# Patient Record
Sex: Male | Born: 1965 | Race: White | Hispanic: No | Marital: Married | Smoking: Current every day smoker
Health system: Southern US, Community
[De-identification: ages and names within clinical notes are randomized; demographics above are authoritative.]

## PROBLEM LIST (undated history)

## (undated) DIAGNOSIS — Z86718 Personal history of other venous thrombosis and embolism: Secondary | ICD-10-CM

## (undated) DIAGNOSIS — E785 Hyperlipidemia, unspecified: Secondary | ICD-10-CM

## (undated) DIAGNOSIS — S129XXA Fracture of neck, unspecified, initial encounter: Secondary | ICD-10-CM

## (undated) DIAGNOSIS — I2510 Atherosclerotic heart disease of native coronary artery without angina pectoris: Secondary | ICD-10-CM

## (undated) DIAGNOSIS — I77819 Aortic ectasia, unspecified site: Secondary | ICD-10-CM

## (undated) DIAGNOSIS — Z86711 Personal history of pulmonary embolism: Secondary | ICD-10-CM

## (undated) DIAGNOSIS — R918 Other nonspecific abnormal finding of lung field: Secondary | ICD-10-CM

## (undated) DIAGNOSIS — M1990 Unspecified osteoarthritis, unspecified site: Secondary | ICD-10-CM

## (undated) DIAGNOSIS — S069X9A Unspecified intracranial injury with loss of consciousness of unspecified duration, initial encounter: Secondary | ICD-10-CM

## (undated) DIAGNOSIS — R030 Elevated blood-pressure reading, without diagnosis of hypertension: Secondary | ICD-10-CM

## (undated) DIAGNOSIS — K760 Fatty (change of) liver, not elsewhere classified: Secondary | ICD-10-CM

## (undated) DIAGNOSIS — J329 Chronic sinusitis, unspecified: Secondary | ICD-10-CM

## (undated) DIAGNOSIS — I708 Atherosclerosis of other arteries: Secondary | ICD-10-CM

## (undated) DIAGNOSIS — J410 Simple chronic bronchitis: Secondary | ICD-10-CM

## (undated) DIAGNOSIS — R7303 Prediabetes: Secondary | ICD-10-CM

## (undated) DIAGNOSIS — Z973 Presence of spectacles and contact lenses: Secondary | ICD-10-CM

## (undated) DIAGNOSIS — Z87820 Personal history of traumatic brain injury: Secondary | ICD-10-CM

## (undated) DIAGNOSIS — I700 Atherosclerosis of aorta: Secondary | ICD-10-CM

## (undated) HISTORY — DX: Fatty (change of) liver, not elsewhere classified: K76.0

## (undated) HISTORY — PX: FACIAL FRACTURE SURGERY: SHX1570

## (undated) HISTORY — DX: Hyperlipidemia, unspecified: E78.5

## (undated) HISTORY — DX: Other nonspecific abnormal finding of lung field: R91.8

## (undated) HISTORY — DX: Aortic ectasia, unspecified site: I77.819

## (undated) HISTORY — DX: Prediabetes: R73.03

## (undated) HISTORY — DX: Atherosclerosis of aorta: I70.0

## (undated) HISTORY — DX: Atherosclerosis of other arteries: I70.8

## (undated) HISTORY — DX: Atherosclerotic heart disease of native coronary artery without angina pectoris: I25.10

---

## 2012-03-01 ENCOUNTER — Encounter (HOSPITAL_COMMUNITY): Payer: Self-pay | Admitting: Pharmacy Technician

## 2012-03-03 ENCOUNTER — Other Ambulatory Visit: Payer: Self-pay | Admitting: Neurological Surgery

## 2012-03-10 ENCOUNTER — Ambulatory Visit (HOSPITAL_COMMUNITY)
Admission: RE | Admit: 2012-03-10 | Discharge: 2012-03-10 | Disposition: A | Discharge status: Home / Self Care | Payer: 59 | Source: Ambulatory Visit | Attending: Neurological Surgery | Admitting: Neurological Surgery

## 2012-03-10 ENCOUNTER — Encounter (HOSPITAL_COMMUNITY): Payer: Self-pay

## 2012-03-10 ENCOUNTER — Encounter (HOSPITAL_COMMUNITY)
Admission: RE | Admit: 2012-03-10 | Discharge: 2012-03-10 | Disposition: A | Discharge status: Home / Self Care | Payer: 59 | Source: Ambulatory Visit | Attending: Neurological Surgery | Admitting: Neurological Surgery

## 2012-03-10 DIAGNOSIS — Z01812 Encounter for preprocedural laboratory examination: Secondary | ICD-10-CM | POA: Insufficient documentation

## 2012-03-10 DIAGNOSIS — Z01810 Encounter for preprocedural cardiovascular examination: Secondary | ICD-10-CM | POA: Insufficient documentation

## 2012-03-10 DIAGNOSIS — Z01818 Encounter for other preprocedural examination: Secondary | ICD-10-CM | POA: Insufficient documentation

## 2012-03-10 HISTORY — DX: Fracture of neck, unspecified, initial encounter: S12.9XXA

## 2012-03-10 HISTORY — DX: Personal history of other venous thrombosis and embolism: Z86.718

## 2012-03-10 HISTORY — DX: Unspecified osteoarthritis, unspecified site: M19.90

## 2012-03-10 HISTORY — DX: Unspecified intracranial injury with loss of consciousness of unspecified duration, initial encounter: S06.9X9A

## 2012-03-10 LAB — CBC WITH DIFFERENTIAL/PLATELET
Basophils Absolute: 0.1 10*3/uL (ref 0.0–0.1)
Basophils Relative: 1 % (ref 0–1)
Eosinophils Absolute: 0.5 10*3/uL (ref 0.0–0.7)
Eosinophils Relative: 6 % — ABNORMAL HIGH (ref 0–5)
HCT: 43.5 % (ref 39.0–52.0)
Hemoglobin: 15.5 g/dL (ref 13.0–17.0)
Lymphocytes Relative: 26 % (ref 12–46)
Lymphs Abs: 2.2 10*3/uL (ref 0.7–4.0)
MCH: 31.5 pg (ref 26.0–34.0)
MCHC: 35.6 g/dL (ref 30.0–36.0)
MCV: 88.4 fL (ref 78.0–100.0)
Monocytes Absolute: 0.5 10*3/uL (ref 0.1–1.0)
Monocytes Relative: 6 % (ref 3–12)
Neutro Abs: 5.1 10*3/uL (ref 1.7–7.7)
Neutrophils Relative %: 61 % (ref 43–77)
Platelets: 195 10*3/uL (ref 150–400)
RBC: 4.92 MIL/uL (ref 4.22–5.81)
RDW: 12.3 % (ref 11.5–15.5)
WBC: 8.4 10*3/uL (ref 4.0–10.5)

## 2012-03-10 LAB — SURGICAL PCR SCREEN
MRSA, PCR: NEGATIVE
Staphylococcus aureus: NEGATIVE

## 2012-03-10 LAB — PROTIME-INR
INR: 0.95 (ref 0.00–1.49)
Prothrombin Time: 12.9 seconds (ref 11.6–15.2)

## 2012-03-10 LAB — BASIC METABOLIC PANEL
BUN: 12 mg/dL (ref 6–23)
CO2: 24 mEq/L (ref 19–32)
Calcium: 9.7 mg/dL (ref 8.4–10.5)
Chloride: 103 mEq/L (ref 96–112)
Creatinine, Ser: 0.75 mg/dL (ref 0.50–1.35)
GFR calc Af Amer: >90 mL/min (ref >90)
GFR calc non Af Amer: >90 mL/min (ref >90)
Glucose, Bld: 108 mg/dL — ABNORMAL HIGH (ref 70–99)
Potassium: 4 mEq/L (ref 3.5–5.1)
Sodium: 139 mEq/L (ref 135–145)

## 2012-03-10 NOTE — Pre-Procedure Instructions (Signed)
20 Jesse Ruiz  03/10/2012   Your procedure is scheduled on:  Wednesday, August 28th.  Report to [facility] at 10:45 AM.  Call this number if you have problems the morning of surgery: [phone]   Remember:   Do not eat food or drink any liquid:After Midnight.      Take these medicines the morning of surgery with A SIP OF WATER: May take Hydrocodone - Acetaminophen if needed.  Stop taking Fish Oil and Ibuprofen.  Do not take any Aspirin NSAIDs or herbal medications.    Do not wear jewelry, make-up or nail polish.  Do not wear lotions, powders, or perfumes. You may wear deodorant.  Do not shave 48 hours prior to surgery. Men may shave face and neck.  Do not bring valuables to the hospital.  Contacts, dentures or bridgework may not be worn into surgery.  Leave suitcase in the car. After surgery it may be brought to your room.  For patients admitted to the hospital, checkout time is 11:00 AM the day of discharge.   Patients discharged the day of surgery will not be allowed to drive home.  Name and phone number of your driver: _________________  Special Instructions: CHG Shower Use Special Wash: 1/2 bottle night before surgery and 1/2 bottle morning of surgery.   Please read over the following fact sheets that you were given: Pain Booklet, Coughing and Deep Breathing and Surgical Site Infection Prevention

## 2012-03-14 MED ORDER — DEXAMETHASONE SODIUM PHOSPHATE 10 MG/ML IJ SOLN
10.0000 mg | INTRAMUSCULAR | Status: AC
  Administered 2012-03-15: 10 mg via INTRAVENOUS
  Filled 2012-03-14: qty 1

## 2012-03-14 MED ORDER — CEFAZOLIN SODIUM-DEXTROSE 2-3 GM-% IV SOLR
2.0000 g | INTRAVENOUS | Status: DC
  Filled 2012-03-14: qty 50

## 2012-03-15 ENCOUNTER — Encounter (HOSPITAL_COMMUNITY): Payer: Self-pay | Admitting: Neurological Surgery

## 2012-03-15 ENCOUNTER — Ambulatory Visit (HOSPITAL_COMMUNITY)
Admission: RE | Admit: 2012-03-15 | Discharge: 2012-03-16 | Disposition: A | Discharge status: Home / Self Care | Payer: 59 | Source: Ambulatory Visit | Attending: Neurological Surgery | Admitting: Neurological Surgery

## 2012-03-15 ENCOUNTER — Encounter (HOSPITAL_COMMUNITY): Payer: Self-pay | Admitting: *Deleted

## 2012-03-15 ENCOUNTER — Ambulatory Visit (HOSPITAL_COMMUNITY): Payer: 59 | Admitting: Anesthesiology

## 2012-03-15 ENCOUNTER — Encounter (HOSPITAL_COMMUNITY): Payer: Self-pay | Admitting: Anesthesiology

## 2012-03-15 ENCOUNTER — Ambulatory Visit (HOSPITAL_COMMUNITY): Payer: 59

## 2012-03-15 ENCOUNTER — Encounter (HOSPITAL_COMMUNITY)
Admission: RE | Disposition: A | Discharge status: Home / Self Care | Payer: Self-pay | Source: Ambulatory Visit | Attending: Neurological Surgery

## 2012-03-15 DIAGNOSIS — M4802 Spinal stenosis, cervical region: Secondary | ICD-10-CM | POA: Insufficient documentation

## 2012-03-15 DIAGNOSIS — G5600 Carpal tunnel syndrome, unspecified upper limb: Secondary | ICD-10-CM | POA: Insufficient documentation

## 2012-03-15 DIAGNOSIS — Z9889 Other specified postprocedural states: Secondary | ICD-10-CM

## 2012-03-15 HISTORY — PX: POSTERIOR CERVICAL LAMINECTOMY: SHX2248

## 2012-03-15 HISTORY — PX: CARPAL TUNNEL RELEASE: SHX101

## 2012-03-15 SURGERY — POSTERIOR CERVICAL LAMINECTOMY
Anesthesia: General | Site: Wrist | Laterality: Right | Wound class: Clean

## 2012-03-15 MED ORDER — PNEUMOCOCCAL VAC POLYVALENT 25 MCG/0.5ML IJ INJ
0.5000 mL | INJECTION | INTRAMUSCULAR | Status: DC
  Filled 2012-03-15: qty 0.5

## 2012-03-15 MED ORDER — SENNA 8.6 MG PO TABS
1.0000 | ORAL_TABLET | Freq: Two times a day (BID) | ORAL | Status: DC
  Administered 2012-03-15 – 2012-03-16 (×2): 8.6 mg via ORAL
  Filled 2012-03-15 (×3): qty 1

## 2012-03-15 MED ORDER — ALBUTEROL SULFATE HFA 108 (90 BASE) MCG/ACT IN AERS
INHALATION_SPRAY | RESPIRATORY_TRACT | Status: DC | PRN
  Administered 2012-03-15: 6 via RESPIRATORY_TRACT

## 2012-03-15 MED ORDER — MENTHOL 3 MG MT LOZG: 1.0000 | LOZENGE | OROMUCOSAL | Status: DC | PRN

## 2012-03-15 MED ORDER — HYDROMORPHONE HCL PF 1 MG/ML IJ SOLN
0.2500 mg | INTRAMUSCULAR | Status: DC | PRN
  Administered 2012-03-15: 0.25 mg via INTRAVENOUS

## 2012-03-15 MED ORDER — CELECOXIB 200 MG PO CAPS
200.0000 mg | ORAL_CAPSULE | Freq: Two times a day (BID) | ORAL | Status: DC
  Administered 2012-03-15 – 2012-03-16 (×2): 200 mg via ORAL
  Filled 2012-03-15 (×3): qty 1

## 2012-03-15 MED ORDER — ACETAMINOPHEN 650 MG RE SUPP: 650.0000 mg | RECTAL | Status: DC | PRN

## 2012-03-15 MED ORDER — CEFAZOLIN SODIUM 1-5 GM-% IV SOLN
1.0000 g | Freq: Three times a day (TID) | INTRAVENOUS | Status: AC
  Administered 2012-03-15 – 2012-03-16 (×2): 1 g via INTRAVENOUS
  Filled 2012-03-15 (×2): qty 50

## 2012-03-15 MED ORDER — KETOROLAC TROMETHAMINE 15 MG/ML IJ SOLN
INTRAMUSCULAR | Status: DC | PRN
  Administered 2012-03-15: 30 mg via INTRAVENOUS

## 2012-03-15 MED ORDER — BACITRACIN 50000 UNITS IM SOLR
INTRAMUSCULAR | Status: AC
  Filled 2012-03-15: qty 1

## 2012-03-15 MED ORDER — THROMBIN 5000 UNITS EX KIT
PACK | CUTANEOUS | Status: DC | PRN
  Administered 2012-03-15 (×2): 5000 [IU] via TOPICAL

## 2012-03-15 MED ORDER — POTASSIUM CHLORIDE IN NACL 20-0.9 MEQ/L-% IV SOLN
INTRAVENOUS | Status: DC
  Filled 2012-03-15 (×3): qty 1000

## 2012-03-15 MED ORDER — LIDOCAINE HCL (CARDIAC) 20 MG/ML IV SOLN
INTRAVENOUS | Status: DC | PRN
  Administered 2012-03-15: 80 mg via INTRAVENOUS

## 2012-03-15 MED ORDER — FENTANYL CITRATE 0.05 MG/ML IJ SOLN
INTRAMUSCULAR | Status: DC | PRN
  Administered 2012-03-15: 200 ug via INTRAVENOUS
  Administered 2012-03-15 (×3): 50 ug via INTRAVENOUS

## 2012-03-15 MED ORDER — ACETAMINOPHEN 325 MG PO TABS: 650.0000 mg | ORAL_TABLET | ORAL | Status: DC | PRN

## 2012-03-15 MED ORDER — DEXAMETHASONE 4 MG PO TABS
4.0000 mg | ORAL_TABLET | Freq: Four times a day (QID) | ORAL | Status: DC
  Administered 2012-03-15 – 2012-03-16 (×2): 4 mg via ORAL
  Filled 2012-03-15 (×6): qty 1

## 2012-03-15 MED ORDER — HYDROCODONE-ACETAMINOPHEN 5-325 MG PO TABS
ORAL_TABLET | ORAL | Status: AC
  Filled 2012-03-15: qty 2

## 2012-03-15 MED ORDER — SODIUM CHLORIDE 0.9 % IV SOLN
INTRAVENOUS | Status: AC
  Filled 2012-03-15: qty 500

## 2012-03-15 MED ORDER — ONDANSETRON HCL 4 MG/2ML IJ SOLN
INTRAMUSCULAR | Status: DC | PRN
  Administered 2012-03-15: 4 mg via INTRAVENOUS

## 2012-03-15 MED ORDER — ROCURONIUM BROMIDE 100 MG/10ML IV SOLN
INTRAVENOUS | Status: DC | PRN
  Administered 2012-03-15 (×2): 10 mg via INTRAVENOUS
  Administered 2012-03-15: 50 mg via INTRAVENOUS
  Administered 2012-03-15: 10 mg via INTRAVENOUS
  Administered 2012-03-15: 20 mg via INTRAVENOUS

## 2012-03-15 MED ORDER — LIDOCAINE HCL 4 % MT SOLN
OROMUCOSAL | Status: DC | PRN
  Administered 2012-03-15: 4 mL via TOPICAL

## 2012-03-15 MED ORDER — NEOSTIGMINE METHYLSULFATE 1 MG/ML IJ SOLN
INTRAMUSCULAR | Status: DC | PRN
  Administered 2012-03-15: 3 mg via INTRAVENOUS

## 2012-03-15 MED ORDER — PROPOFOL 10 MG/ML IV EMUL
INTRAVENOUS | Status: DC | PRN
  Administered 2012-03-15: 200 mg via INTRAVENOUS

## 2012-03-15 MED ORDER — HYDROCODONE-ACETAMINOPHEN 5-325 MG PO TABS
1.0000 | ORAL_TABLET | ORAL | Status: DC | PRN
  Administered 2012-03-15 – 2012-03-16 (×4): 2 via ORAL
  Filled 2012-03-15 (×3): qty 2

## 2012-03-15 MED ORDER — THROMBIN 5000 UNITS EX SOLR
OROMUCOSAL | Status: DC | PRN
  Administered 2012-03-15: 15:00:00 via TOPICAL

## 2012-03-15 MED ORDER — ONDANSETRON HCL 4 MG/2ML IJ SOLN: 4.0000 mg | INTRAMUSCULAR | Status: DC | PRN

## 2012-03-15 MED ORDER — 0.9 % SODIUM CHLORIDE (POUR BTL) OPTIME
TOPICAL | Status: DC | PRN
  Administered 2012-03-15: 1000 mL

## 2012-03-15 MED ORDER — CYCLOBENZAPRINE HCL 10 MG PO TABS
ORAL_TABLET | ORAL | Status: AC
Start: 2012-03-15 — End: 2012-03-16
  Filled 2012-03-15: qty 1

## 2012-03-15 MED ORDER — ZOLPIDEM TARTRATE 5 MG PO TABS: 5.0000 mg | ORAL_TABLET | Freq: Every evening | ORAL | Status: DC | PRN

## 2012-03-15 MED ORDER — HYDROMORPHONE HCL PF 1 MG/ML IJ SOLN
INTRAMUSCULAR | Status: AC
  Filled 2012-03-15: qty 1

## 2012-03-15 MED ORDER — MIDAZOLAM HCL 5 MG/5ML IJ SOLN
INTRAMUSCULAR | Status: DC | PRN
  Administered 2012-03-15: 2 mg via INTRAVENOUS

## 2012-03-15 MED ORDER — MORPHINE SULFATE 2 MG/ML IJ SOLN
1.0000 mg | INTRAMUSCULAR | Status: DC | PRN
  Administered 2012-03-16: 2 mg via INTRAVENOUS
  Filled 2012-03-15: qty 1

## 2012-03-15 MED ORDER — DEXAMETHASONE SODIUM PHOSPHATE 4 MG/ML IJ SOLN
4.0000 mg | Freq: Four times a day (QID) | INTRAMUSCULAR | Status: DC
  Administered 2012-03-15: 4 mg via INTRAVENOUS
  Filled 2012-03-15 (×5): qty 1

## 2012-03-15 MED ORDER — HEMOSTATIC AGENTS (NO CHARGE) OPTIME
TOPICAL | Status: DC | PRN
  Administered 2012-03-15: 1 via TOPICAL

## 2012-03-15 MED ORDER — SODIUM CHLORIDE 0.9 % IJ SOLN: 3.0000 mL | INTRAMUSCULAR | Status: DC | PRN

## 2012-03-15 MED ORDER — SODIUM CHLORIDE 0.9 % IJ SOLN
3.0000 mL | Freq: Two times a day (BID) | INTRAMUSCULAR | Status: DC
  Administered 2012-03-15 – 2012-03-16 (×2): 3 mL via INTRAVENOUS

## 2012-03-15 MED ORDER — GLYCOPYRROLATE 0.2 MG/ML IJ SOLN
INTRAMUSCULAR | Status: DC | PRN
  Administered 2012-03-15: .4 mg via INTRAVENOUS

## 2012-03-15 MED ORDER — SODIUM CHLORIDE 0.9 % IV SOLN: 250.0000 mL | INTRAVENOUS | Status: DC

## 2012-03-15 MED ORDER — ONDANSETRON HCL 4 MG/2ML IJ SOLN: 4.0000 mg | Freq: Once | INTRAMUSCULAR | Status: DC | PRN

## 2012-03-15 MED ORDER — PHENOL 1.4 % MT LIQD: 1.0000 | OROMUCOSAL | Status: DC | PRN

## 2012-03-15 MED ORDER — LACTATED RINGERS IV SOLN
INTRAVENOUS | Status: DC | PRN
  Administered 2012-03-15 (×2): via INTRAVENOUS

## 2012-03-15 MED ORDER — SODIUM CHLORIDE 0.9 % IR SOLN
Status: DC | PRN
  Administered 2012-03-15 (×2)

## 2012-03-15 MED ORDER — PHENYLEPHRINE HCL 10 MG/ML IJ SOLN
INTRAMUSCULAR | Status: DC | PRN
  Administered 2012-03-15 (×2): 80 ug via INTRAVENOUS

## 2012-03-15 MED ORDER — BUPIVACAINE HCL (PF) 0.25 % IJ SOLN
INTRAMUSCULAR | Status: DC | PRN
  Administered 2012-03-15: 30 mL
  Administered 2012-03-15: 9 mL

## 2012-03-15 MED ORDER — CYCLOBENZAPRINE HCL 10 MG PO TABS
10.0000 mg | ORAL_TABLET | Freq: Three times a day (TID) | ORAL | Status: DC | PRN
  Administered 2012-03-15 – 2012-03-16 (×2): 10 mg via ORAL
  Filled 2012-03-15: qty 1

## 2012-03-15 SURGICAL SUPPLY — 63 items
BAG DECANTER FOR FLEXI CONT (MISCELLANEOUS) ×6 IMPLANT
BANDAGE ELASTIC 4 VELCRO ST LF (GAUZE/BANDAGES/DRESSINGS) ×3 IMPLANT
BANDAGE GAUZE ELAST BULKY 4 IN (GAUZE/BANDAGES/DRESSINGS) ×3 IMPLANT
BENZOIN TINCTURE PRP APPL 2/3 (GAUZE/BANDAGES/DRESSINGS) IMPLANT
BLADE SURG 15 STRL LF DISP TIS (BLADE) ×2 IMPLANT
BLADE SURG 15 STRL SS (BLADE) ×1
BLADE SURG ROTATE 9660 (MISCELLANEOUS) IMPLANT
BUR MATCHSTICK NEURO 3.0 LAGG (BURR) ×3 IMPLANT
CANISTER SUCTION 2500CC (MISCELLANEOUS) ×6 IMPLANT
CLOTH BEACON ORANGE TIMEOUT ST (SAFETY) ×6 IMPLANT
CONT SPEC 4OZ CLIKSEAL STRL BL (MISCELLANEOUS) ×3 IMPLANT
CORDS BIPOLAR (ELECTRODE) ×3 IMPLANT
DRAPE C-ARM 42X72 X-RAY (DRAPES) ×6 IMPLANT
DRAPE EXTREMITY T 121X128X90 (DRAPE) ×3 IMPLANT
DRAPE LAPAROTOMY 100X72 PEDS (DRAPES) ×3 IMPLANT
DRAPE MICROSCOPE LEICA (MISCELLANEOUS) ×3 IMPLANT
DRAPE MICROSCOPE ZEISS OPMI (DRAPES) IMPLANT
DRAPE POUCH INSTRU U-SHP 10X18 (DRAPES) ×3 IMPLANT
DRESSING TELFA 8X3 (GAUZE/BANDAGES/DRESSINGS) ×3 IMPLANT
DRSG EMULSION OIL 3X3 NADH (GAUZE/BANDAGES/DRESSINGS) ×3 IMPLANT
DRSG OPSITE 4X5.5 SM (GAUZE/BANDAGES/DRESSINGS) ×3 IMPLANT
DURAPREP 26ML APPLICATOR (WOUND CARE) ×6 IMPLANT
ELECT REM PT RETURN 9FT ADLT (ELECTROSURGICAL) ×3
ELECTRODE REM PT RTRN 9FT ADLT (ELECTROSURGICAL) ×2 IMPLANT
GAUZE SPONGE 4X4 16PLY XRAY LF (GAUZE/BANDAGES/DRESSINGS) ×3 IMPLANT
GLOVE BIO SURGEON STRL SZ8 (GLOVE) ×6 IMPLANT
GLOVE BIOGEL PI IND STRL 7.0 (GLOVE) ×4 IMPLANT
GLOVE BIOGEL PI INDICATOR 7.0 (GLOVE) ×2
GLOVE SURG SS PI 7.0 STRL IVOR (GLOVE) ×12 IMPLANT
GOWN BRE IMP SLV AUR LG STRL (GOWN DISPOSABLE) IMPLANT
GOWN BRE IMP SLV AUR XL STRL (GOWN DISPOSABLE) ×12 IMPLANT
GOWN STRL REIN 2XL LVL4 (GOWN DISPOSABLE) IMPLANT
HEMOSTAT POWDER KIT SURGIFOAM (HEMOSTASIS) ×3 IMPLANT
KIT BASIN OR (CUSTOM PROCEDURE TRAY) ×6 IMPLANT
KIT ROOM TURNOVER OR (KITS) ×3 IMPLANT
NEEDLE HYPO 22GX1.5 SAFETY (NEEDLE) ×3 IMPLANT
NEEDLE HYPO 25X1 1.5 SAFETY (NEEDLE) ×3 IMPLANT
NEEDLE SPNL 20GX3.5 QUINCKE YW (NEEDLE) IMPLANT
NS IRRIG 1000ML POUR BTL (IV SOLUTION) ×6 IMPLANT
PACK LAMINECTOMY NEURO (CUSTOM PROCEDURE TRAY) ×3 IMPLANT
PACK SURGICAL SETUP 50X90 (CUSTOM PROCEDURE TRAY) ×3 IMPLANT
PAD ARMBOARD 7.5X6 YLW CONV (MISCELLANEOUS) ×15 IMPLANT
PIN MAYFIELD SKULL DISP (PIN) ×3 IMPLANT
RUBBERBAND STERILE (MISCELLANEOUS) ×6 IMPLANT
SPONGE GAUZE 4X4 12PLY (GAUZE/BANDAGES/DRESSINGS) IMPLANT
SPONGE LAP 4X18 X RAY DECT (DISPOSABLE) IMPLANT
SPONGE SURGIFOAM ABS GEL SZ50 (HEMOSTASIS) ×3 IMPLANT
STOCKINETTE 4X48 STRL (DRAPES) ×3 IMPLANT
STRIP CLOSURE SKIN 1/2X4 (GAUZE/BANDAGES/DRESSINGS) ×3 IMPLANT
SUT ETHILON 4 0 PS 2 18 (SUTURE) ×3 IMPLANT
SUT VIC AB 0 CT1 18XCR BRD8 (SUTURE) ×2 IMPLANT
SUT VIC AB 0 CT1 8-18 (SUTURE) ×1
SUT VIC AB 2-0 CP2 18 (SUTURE) ×3 IMPLANT
SUT VIC AB 3-0 SH 8-18 (SUTURE) ×9 IMPLANT
SWABSTICK BENZOIN STERILE (MISCELLANEOUS) ×3 IMPLANT
SYR 20ML ECCENTRIC (SYRINGE) ×3 IMPLANT
SYR BULB 3OZ (MISCELLANEOUS) ×3 IMPLANT
SYR CONTROL 10ML LL (SYRINGE) ×3 IMPLANT
TOWEL OR 17X24 6PK STRL BLUE (TOWEL DISPOSABLE) ×6 IMPLANT
TOWEL OR 17X26 10 PK STRL BLUE (TOWEL DISPOSABLE) ×6 IMPLANT
TUBE CONNECTING 12X1/4 (SUCTIONS) ×3 IMPLANT
UNDERPAD 30X30 INCONTINENT (UNDERPADS AND DIAPERS) ×3 IMPLANT
WATER STERILE IRR 1000ML POUR (IV SOLUTION) ×6 IMPLANT

## 2012-03-15 NOTE — Addendum Note (Signed)
Addendum  created 03/15/12 1650 by Chara Marquard A Daymen Hassebrock, MD   Modules edited:Anesthesia Attestations    

## 2012-03-15 NOTE — OR Nursing (Signed)
End time for Procedure 1 Carpel tunnel release-14:30.

## 2012-03-15 NOTE — Anesthesia Postprocedure Evaluation (Signed)
  Anesthesia Post-op Note  Patient: Jesse Ruiz  Procedure(s) Performed: Procedure(s) (LRB): POSTERIOR CERVICAL LAMINECTOMY (Right) CARPAL TUNNEL RELEASE (Right)  Patient Location: PACU  Anesthesia Type: General  Level of Consciousness: awake, alert  and oriented  Airway and Oxygen Therapy: Patient Spontanous Breathing and Patient connected to nasal cannula oxygen  Post-op Pain: none  Post-op Assessment: Post-op Vital signs reviewed  Post-op Vital Signs: Reviewed  Complications: No apparent anesthesia complications

## 2012-03-15 NOTE — Op Note (Signed)
03/15/2012  4:20 PM  PATIENT:  Jesse Ruiz  46 y.o. male  PRE-OPERATIVE DIAGNOSIS:  1. 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 Right median neuropathy  POST-OPERATIVE DIAGNOSIS:  Same  PROCEDURE:  1. Right C7-T1 foraminotomy for decompression of the right C8 nerve root utilizing microscopic dissection, 2. Right carpal tunnel release  SURGEON:  Marikay Alar, MD  ASSISTANTS: Dr. Franky Macho  ANESTHESIA:   General  EBL: 100 ml  Total I/O In: 1500 [I.V.:1500] Out: 100 [Blood:100]  BLOOD ADMINISTERED:none  DRAINS: None   SPECIMEN:  No Specimen  INDICATION FOR PROCEDURE: This patient presented with progressive pain in his right arm weakness in his right hand. MRI showed spondylosis with neural foraminal stenosis C7-T1 on the right. EMGs confirmed a right C8 radiculopathy and a severe median neuropathy. Recommended a right C7-T1 foraminotomies and a right carpal tunnel release.  Patient understood the risks, benefits, and alternatives and potential outcomes and wished to proceed.  PROCEDURE DETAILS: The patient was brought to the operating room. Generalized endotracheal anesthesia was induced. His right arm was extended on an armboard and prepped circumferentially from the fingertips to above the elbow with DuraPrep. It was then draped in the usual sterile fashion. 9 cc of local anesthesia was injected and a small palmar incision was made from the distal wrist crease into the palm in line with the web space between the third and fourth digits. I dissected down through the palmar fascia and identified the transverse carpal ligament. This was opened with a 15 blade scalpel to expose the underlying median nerve. I then spread between the nerve and the ligament with a mosquito and completely transected the ligament both proximally and distally with the 15 blade scalpel or with the Metzenbaum scissors. Once this was completed I could palpate easily both proximally and distally  with a mosquito to assure adequate decompression and transection of the ligament. The nerve was free. I irrigated with saline solution containing bacitracin and dried all bleeding points. I then placed a single stitch and the palmar fascia. I then closed the subcuticular tissues with 0 Vicryl and closed the skin with interrupted 4-0 Ethilon vertical mattress sutures. The hand was then wrapped in a Curlex and Ace bandage. The patient was then repositioned in the prone position. The patient was affixed a 3 point Mayfield headrest and rolled into the prone position on chest rolls. All pressure points were padded. The posterior cervical region was cleaned and prepped with DuraPrep and then draped in the usual sterile fashion. 7 cc of local anesthesia was injected and a dorsal midline incision made in the posterior cervical region and carried down to the cervical fascia. The fascia was opened and the paraspinous musculature was taken down to expose C7-T1 on the right. Intraoperative fluoroscopy confirmed my level at C6-7 complete for visualization, therefore I moved down one level to C7-T1. and then the dissection was carried out over the lateral facets of C7 and T1. I then used the drill to drill a keyhole foraminotomies at C7-T1. I opened the yellow ligament and removed it performed a generous foraminotomies C7-T1 on the right. The C8 nerve root was identified. I marked along the nerve root with a 2 mm Kerrison punch to decompress into Osborne distal to the pedicle. I nerve hook passed easily along the pedicle and along the nerve root. I decompress the shoulder of the nerve root. I then coagulated the epidural venous vasculature in the axilla of the nerve root and  checked for a herniated disc. On a hard spur/ disc complex that I did not feel needed to be incised or removed. Again the nerve hook passed easily along the nerve the nerve appeared to be well decompressed. I irrigated with saline solution containing  bacitracin and lined the dura with Gelfoam. After hemostasis was achieved a closed the muscle and the fascia with 0 Vicryl, subcutaneous tissue with 2-0 Vicryl, and the subcuticular tissue with 3-0 Vicryl. The skin was closed with benzoin and Steri-Strips. A sterile dressing was applied, the patient was turned to the supine physician and taken out of the headrest, awakened from general anesthesia and transferred to the recovery room in stable condition. At the end of the procedure all sponge, needle and instrument counts were correct.  PLAN OF CARE: Admit for overnight observation  PATIENT DISPOSITION:  PACU - hemodynamically stable.   Delay start of Pharmacological VTE agent (>24hrs) due to surgical blood loss or risk of bleeding:  yes
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
## 2012-03-15 NOTE — Anesthesia Preprocedure Evaluation (Signed)
Anesthesia Evaluation  Patient identified by MRN, date of birth, ID band Patient awake    Reviewed: Allergy & Precautions, H&P , NPO status , Patient's Chart, lab work & pertinent test results  Airway Mallampati: I TM Distance: >3 FB Neck ROM: Full    Dental  (+) Teeth Intact and Dental Advisory Given   Pulmonary  breath sounds clear to auscultation        Cardiovascular Rhythm:Regular Rate:Normal     Neuro/Psych    GI/Hepatic   Endo/Other    Renal/GU      Musculoskeletal   Abdominal   Peds  Hematology   Anesthesia Other Findings   Reproductive/Obstetrics                           Anesthesia Physical Anesthesia Plan  ASA: II  Anesthesia Plan: General   Post-op Pain Management:    Induction: Intravenous  Airway Management Planned: Oral ETT  Additional Equipment:   Intra-op Plan:   Post-operative Plan: Extubation in OR  Informed Consent: I have reviewed the patients History and Physical, chart, labs and discussed the procedure including the risks, benefits and alternatives for the proposed anesthesia with the patient or authorized representative who has indicated his/her understanding and acceptance.   Dental advisory given  Plan Discussed with: CRNA, Anesthesiologist and Surgeon  Anesthesia Plan Comments:         Anesthesia Quick Evaluation  

## 2012-03-15 NOTE — Addendum Note (Signed)
Addendum  created 03/15/12 1650 by Kerby Nora, MD   Modules edited:Anesthesia Attestations

## 2012-03-15 NOTE — Preoperative (Signed)
Beta Blockers   Reason not to administer Beta Blockers:Not Applicable 

## 2012-03-15 NOTE — Transfer of Care (Signed)
Immediate Anesthesia Transfer of Care Note  Patient: Jesse Ruiz  Procedure(s) Performed: Procedure(s) (LRB): POSTERIOR CERVICAL LAMINECTOMY (Right) CARPAL TUNNEL RELEASE (Right)  Patient Location: PACU  Anesthesia Type: General  Level of Consciousness: awake and alert   Airway & Oxygen Therapy: Patient Spontanous Breathing and Patient connected to face mask oxygen  Post-op Assessment: Report given to PACU RN  Post vital signs: Reviewed and stable  Complications: No apparent anesthesia complications

## 2012-03-16 ENCOUNTER — Encounter (HOSPITAL_COMMUNITY): Payer: Self-pay | Admitting: Neurological Surgery

## 2012-03-16 MED ORDER — HYDROCODONE-ACETAMINOPHEN 5-325 MG PO TABS: 1.0000 | ORAL_TABLET | ORAL | Status: DC | PRN

## 2012-03-16 MED ORDER — CYCLOBENZAPRINE HCL 10 MG PO TABS: 10.0000 mg | ORAL_TABLET | Freq: Three times a day (TID) | ORAL | Status: AC | PRN

## 2012-03-16 NOTE — Discharge Summary (Signed)
Physician Discharge Summary  Patient ID: Jesse Ruiz MRN: [id number] DOB/AGE: September 25, 1965 46 y.o.  Admit date: 03/15/2012 Discharge date: 03/16/2012  Admission Diagnoses: CTS/ R C8 radic    Discharge Diagnoses: same   Discharged Condition: good  Hospital Course: The patient was admitted on 03/15/2012 and taken to the operating room where the patient underwent R CTR and R C7-T1 foraminotomy. The patient tolerated the procedure well and was taken to the recovery room and then to the floor in stable condition. The hospital course was routine. There were no complications. The wound remained clean dry and intact. Pt had appropriate neck soreness. No complaints of arm pain or new N/T/W. The patient remained afebrile with stable vital signs, and tolerated a regular diet. The patient continued to increase activities, and pain was well controlled with oral pain medications.   Consults: None  Significant Diagnostic Studies:  Results for orders placed during the hospital encounter of 03/10/12  SURGICAL PCR SCREEN      Component Value Range   MRSA, PCR NEGATIVE  NEGATIVE   Staphylococcus aureus NEGATIVE  NEGATIVE  CBC WITH DIFFERENTIAL      Component Value Range   WBC 8.4  4.0 - 10.5 K/uL   RBC 4.92  4.22 - 5.81 MIL/uL   Hemoglobin 15.5  13.0 - 17.0 g/dL   HCT 40.9  81.1 - 91.4 %   MCV 88.4  78.0 - 100.0 fL   MCH 31.5  26.0 - 34.0 pg   MCHC 35.6  30.0 - 36.0 g/dL   RDW 78.2  95.6 - 21.3 %   Platelets 195  150 - 400 K/uL   Neutrophils Relative 61  43 - 77 %   Neutro Abs 5.1  1.7 - 7.7 K/uL   Lymphocytes Relative 26  12 - 46 %   Lymphs Abs 2.2  0.7 - 4.0 K/uL   Monocytes Relative 6  3 - 12 %   Monocytes Absolute 0.5  0.1 - 1.0 K/uL   Eosinophils Relative 6 (*) 0 - 5 %   Eosinophils Absolute 0.5  0.0 - 0.7 K/uL   Basophils Relative 1  0 - 1 %   Basophils Absolute 0.1  0.0 - 0.1 K/uL  BASIC METABOLIC PANEL      Component Value Range   Sodium 139  135 - 145 mEq/L   Potassium 4.0  3.5 -  5.1 mEq/L   Chloride 103  96 - 112 mEq/L   CO2 24  19 - 32 mEq/L   Glucose, Bld 108 (*) 70 - 99 mg/dL   BUN 12  6 - 23 mg/dL   Creatinine, Ser 0.86  0.50 - 1.35 mg/dL   Calcium 9.7  8.4 - 57.8 mg/dL   GFR calc non Af Amer >90  >90 mL/min   GFR calc Af Amer >90  >90 mL/min  PROTIME-INR      Component Value Range   Prothrombin Time 12.9  11.6 - 15.2 seconds   INR 0.95  0.00 - 1.49    Dg Chest 2 View  03/10/2012  *RADIOLOGY REPORT*  Clinical Data: Preop for cervical surgery.  CHEST - 2 VIEW  Comparison: None.  Findings: Cardiomediastinal silhouette appears normal.  No acute pulmonary disease is noted.  Bony thorax is intact.  IMPRESSION: No acute cardiopulmonary abnormality seen.   Original Report Authenticated By: Venita Sheffield., M.D.    Dg Cervical Spine 2-3 Views  03/15/2012  *RADIOLOGY REPORT*  Clinical Data: C7-T1 posterior laminectomy.  CERVICAL SPINE - 2-3 VIEW  Technique: Two intraoperative fluoro spot images are submitted.  Comparison:  None.  Findings: The C3 and C4 vertebral bodies are fused anteriorly and posteriorly.  The second image demonstrates a surgical probe posteriorly at the level of C6-7.  The cervicothoracic junction is significantly oblique on this image.  IMPRESSION: Intraoperative localization of the posterior elements at C6-7.   Original Report Authenticated By: Jamesetta Orleans. MATTERN, M.D.    Dg C-arm 1-60 Min  03/15/2012  *RADIOLOGY REPORT*  Clinical Data: C7-T1 posterior laminectomy.  CERVICAL SPINE - 2-3 VIEW  Technique: Two intraoperative fluoro spot images are submitted.  Comparison:  None.  Findings: The C3 and C4 vertebral bodies are fused anteriorly and posteriorly.  The second image demonstrates a surgical probe posteriorly at the level of C6-7.  The cervicothoracic junction is significantly oblique on this image.  IMPRESSION: Intraoperative localization of the posterior elements at C6-7.   Original Report Authenticated By: Jamesetta Orleans. MATTERN, M.D.      Antibiotics:  Anti-infectives     Start     Dose/Rate Route Frequency Ordered Stop   03/15/12 1730   ceFAZolin (ANCEF) IVPB 1 g/50 mL premix        1 g 100 mL/hr over 30 Minutes Intravenous Every 8 hours 03/15/12 1722 03/16/12 0228   03/15/12 1517   bacitracin 50,000 Units in sodium chloride irrigation 0.9 % 500 mL irrigation  Status:  Discontinued          As needed 03/15/12 1517 03/15/12 1623   03/15/12 1402   bacitracin 81191 UNITS injection     Comments: DAY, DORY: cabinet override         03/15/12 1402 03/16/12 0214   03/15/12 1326   bacitracin 47829 UNITS injection     Comments: DAY, DORY: cabinet override         03/15/12 1326 03/16/12 0129   03/15/12 0000   ceFAZolin (ANCEF) IVPB 2 g/50 mL premix  Status:  Discontinued        2 g 100 mL/hr over 30 Minutes Intravenous 60 min pre-op 03/14/12 1517 03/15/12 1721          Discharge Exam: Blood pressure 127/69, pulse 76, temperature 98.7 F (37.1 C), temperature source Oral, resp. rate 18, SpO2 93.00%. Neurologic: Grossly normal except for stable pre-op r hand weakness Incision ok  Discharge Medications:   Medication List  As of 03/16/2012  7:54 AM   TAKE these medications         cyclobenzaprine 10 MG tablet   Commonly known as: FLEXERIL   Take 1 tablet (10 mg total) by mouth 3 (three) times daily as needed for muscle spasms.      fish oil-omega-3 fatty acids 1000 MG capsule   Take 1 g by mouth daily.      HYDROcodone-acetaminophen 5-325 MG per tablet   Commonly known as: NORCO/VICODIN   Take 1-2 tablets by mouth every 4 (four) hours as needed for pain. For pain      ibuprofen 200 MG tablet   Commonly known as: ADVIL,MOTRIN   Take 400 mg by mouth every 6 (six) hours as needed. For pain            Disposition: home   Final Dx: CTR/ C7-T1 foraminotomy  Discharge Orders    Future Orders Please Complete By Expires   Diet - low sodium heart healthy      Increase activity slowly      Driving  Restrictions  Comments:   1 week   Lifting restrictions      Comments:   No more than 8 lbs   Remove dressing in 48 hours      Scheduling Instructions:   Remove hand wrap in 5 days, keep hand clean/ dry, no heavy lifting with hand   Call MD for:  temperature >100.4      Call MD for:  persistant nausea and vomiting      Call MD for:  severe uncontrolled pain      Call MD for:  redness, tenderness, or signs of infection (pain, swelling, redness, odor or green/yellow discharge around incision site)      Call MD for:  difficulty breathing, headache or visual disturbances         Follow-up Information    Follow up with JONES,DAVID S, MD. Schedule an appointment as soon as possible for a visit in 2 weeks.   Contact information:   [address] [place] [address] [phone]           Signed: Tia Alert 03/16/2012, 7:54 AM

## 2012-03-26 ENCOUNTER — Encounter (HOSPITAL_COMMUNITY): Payer: Self-pay | Admitting: Anesthesiology

## 2012-03-26 ENCOUNTER — Ambulatory Visit (HOSPITAL_COMMUNITY): Admission: RE | Admit: 2012-03-26 | Payer: 59 | Source: Ambulatory Visit | Admitting: Neurosurgery

## 2012-03-26 ENCOUNTER — Ambulatory Visit: Admit: 2012-03-26 | Payer: Self-pay | Admitting: Neurosurgery

## 2012-03-26 ENCOUNTER — Encounter (HOSPITAL_COMMUNITY)
Admission: EM | Disposition: A | Discharge status: Home / Self Care | Payer: Self-pay | Source: Home / Self Care | Attending: Neurosurgery

## 2012-03-26 ENCOUNTER — Inpatient Hospital Stay (HOSPITAL_COMMUNITY)
Admission: EM | Admit: 2012-03-26 | Discharge: 2012-03-27 | DRG: 863 | Disposition: A | Discharge status: Home / Self Care | Payer: 59 | Attending: Neurosurgery | Admitting: Neurosurgery

## 2012-03-26 DIAGNOSIS — Z885 Allergy status to narcotic agent status: Secondary | ICD-10-CM

## 2012-03-26 DIAGNOSIS — M129 Arthropathy, unspecified: Secondary | ICD-10-CM | POA: Diagnosis present

## 2012-03-26 DIAGNOSIS — F17200 Nicotine dependence, unspecified, uncomplicated: Secondary | ICD-10-CM | POA: Diagnosis present

## 2012-03-26 DIAGNOSIS — L02519 Cutaneous abscess of unspecified hand: Secondary | ICD-10-CM | POA: Diagnosis present

## 2012-03-26 DIAGNOSIS — Z792 Long term (current) use of antibiotics: Secondary | ICD-10-CM

## 2012-03-26 DIAGNOSIS — T8140XA Infection following a procedure, unspecified, initial encounter: Principal | ICD-10-CM | POA: Diagnosis present

## 2012-03-26 DIAGNOSIS — L089 Local infection of the skin and subcutaneous tissue, unspecified: Secondary | ICD-10-CM

## 2012-03-26 DIAGNOSIS — Y838 Other surgical procedures as the cause of abnormal reaction of the patient, or of later complication, without mention of misadventure at the time of the procedure: Secondary | ICD-10-CM | POA: Diagnosis present

## 2012-03-26 DIAGNOSIS — L03119 Cellulitis of unspecified part of limb: Secondary | ICD-10-CM | POA: Diagnosis present

## 2012-03-26 DIAGNOSIS — Y92009 Unspecified place in unspecified non-institutional (private) residence as the place of occurrence of the external cause: Secondary | ICD-10-CM

## 2012-03-26 HISTORY — PX: CARPAL TUNNEL RELEASE: SHX101

## 2012-03-26 SURGERY — CARPAL TUNNEL RELEASE
Anesthesia: Monitor Anesthesia Care | Site: Wrist | Laterality: Right | Wound class: Dirty or Infected

## 2012-03-26 SURGERY — CARPAL TUNNEL RELEASE
Anesthesia: Monitor Anesthesia Care | Laterality: Right

## 2012-03-26 MED ORDER — BUPIVACAINE HCL 0.25 % IJ SOLN
INTRAMUSCULAR | Status: DC | PRN
  Administered 2012-03-26: 15 mL

## 2012-03-26 MED ORDER — MORPHINE SULFATE (PF) 1 MG/ML IV SOLN
INTRAVENOUS | Status: DC
  Administered 2012-03-26 (×2): via INTRAVENOUS
  Administered 2012-03-27: 6 mg via INTRAVENOUS
  Filled 2012-03-26: qty 25

## 2012-03-26 MED ORDER — PHENOL 1.4 % MT LIQD
1.0000 | OROMUCOSAL | Status: DC | PRN
  Filled 2012-03-26: qty 177

## 2012-03-26 MED ORDER — ONDANSETRON HCL 4 MG/2ML IJ SOLN: 4.0000 mg | INTRAMUSCULAR | Status: DC | PRN

## 2012-03-26 MED ORDER — MIDAZOLAM HCL 2 MG/2ML IJ SOLN: 0.5000 mg | Freq: Once | INTRAMUSCULAR | Status: DC | PRN

## 2012-03-26 MED ORDER — MEPERIDINE HCL 25 MG/ML IJ SOLN: 6.2500 mg | INTRAMUSCULAR | Status: DC | PRN

## 2012-03-26 MED ORDER — SODIUM CHLORIDE 0.9 % IJ SOLN: 3.0000 mL | INTRAMUSCULAR | Status: DC | PRN

## 2012-03-26 MED ORDER — SODIUM CHLORIDE 0.9 % IJ SOLN
3.0000 mL | Freq: Two times a day (BID) | INTRAMUSCULAR | Status: DC
  Administered 2012-03-26: 3 mL via INTRAVENOUS

## 2012-03-26 MED ORDER — ZOLPIDEM TARTRATE 5 MG PO TABS: 10.0000 mg | ORAL_TABLET | Freq: Every evening | ORAL | Status: DC | PRN

## 2012-03-26 MED ORDER — CEFAZOLIN SODIUM 1-5 GM-% IV SOLN
1.0000 g | Freq: Three times a day (TID) | INTRAVENOUS | Status: AC
  Administered 2012-03-26 – 2012-03-27 (×2): 1 g via INTRAVENOUS
  Filled 2012-03-26 (×2): qty 50

## 2012-03-26 MED ORDER — PROMETHAZINE HCL 25 MG/ML IJ SOLN: 6.2500 mg | INTRAMUSCULAR | Status: DC | PRN

## 2012-03-26 MED ORDER — FENTANYL CITRATE 0.05 MG/ML IJ SOLN: 25.0000 ug | INTRAMUSCULAR | Status: DC | PRN

## 2012-03-26 MED ORDER — MORPHINE SULFATE 4 MG/ML IJ SOLN: 4.0000 mg | INTRAMUSCULAR | Status: DC | PRN

## 2012-03-26 MED ORDER — SODIUM CHLORIDE 0.9 % IJ SOLN: 9.0000 mL | INTRAMUSCULAR | Status: DC | PRN

## 2012-03-26 MED ORDER — ACETAMINOPHEN 650 MG RE SUPP: 650.0000 mg | RECTAL | Status: DC | PRN

## 2012-03-26 MED ORDER — SODIUM CHLORIDE 0.9 % IV SOLN: 250.0000 mL | INTRAVENOUS | Status: DC

## 2012-03-26 MED ORDER — 0.9 % SODIUM CHLORIDE (POUR BTL) OPTIME
TOPICAL | Status: DC | PRN
  Administered 2012-03-26: 1000 mL

## 2012-03-26 MED ORDER — OXYCODONE-ACETAMINOPHEN 5-325 MG PO TABS
1.0000 | ORAL_TABLET | ORAL | Status: DC | PRN
  Filled 2012-03-26: qty 2

## 2012-03-26 MED ORDER — ACETAMINOPHEN 325 MG PO TABS: 650.0000 mg | ORAL_TABLET | ORAL | Status: DC | PRN

## 2012-03-26 MED ORDER — DIPHENHYDRAMINE HCL 50 MG/ML IJ SOLN: 12.5000 mg | Freq: Four times a day (QID) | INTRAMUSCULAR | Status: DC | PRN

## 2012-03-26 MED ORDER — SODIUM CHLORIDE 0.9 % IV SOLN: INTRAVENOUS | Status: DC

## 2012-03-26 MED ORDER — NALOXONE HCL 0.4 MG/ML IJ SOLN: 0.4000 mg | INTRAMUSCULAR | Status: DC | PRN

## 2012-03-26 MED ORDER — ONDANSETRON HCL 4 MG/2ML IJ SOLN: 4.0000 mg | Freq: Four times a day (QID) | INTRAMUSCULAR | Status: DC | PRN

## 2012-03-26 MED ORDER — MENTHOL 3 MG MT LOZG
1.0000 | LOZENGE | OROMUCOSAL | Status: DC | PRN
  Filled 2012-03-26: qty 9

## 2012-03-26 MED ORDER — MORPHINE SULFATE (PF) 1 MG/ML IV SOLN
INTRAVENOUS | Status: AC
  Filled 2012-03-26: qty 25

## 2012-03-26 MED ORDER — DIPHENHYDRAMINE HCL 12.5 MG/5ML PO ELIX: 12.5000 mg | ORAL_SOLUTION | Freq: Four times a day (QID) | ORAL | Status: DC | PRN

## 2012-03-26 SURGICAL SUPPLY — 9 items
BANDAGE GAUZE ELAST BULKY 4 IN (GAUZE/BANDAGES/DRESSINGS) ×2 IMPLANT
CLOTH BEACON ORANGE TIMEOUT ST (SAFETY) ×2 IMPLANT
GOWN BRE IMP SLV AUR LG STRL (GOWN DISPOSABLE) ×4 IMPLANT
KIT BASIN OR (CUSTOM PROCEDURE TRAY) ×2 IMPLANT
KIT ROOM TURNOVER OR (KITS) ×2 IMPLANT
PACK ORTHO EXTREMITY (CUSTOM PROCEDURE TRAY) ×2 IMPLANT
PAD ARMBOARD 7.5X6 YLW CONV (MISCELLANEOUS) ×4 IMPLANT
SPONGE GAUZE 4X4 12PLY (GAUZE/BANDAGES/DRESSINGS) ×2 IMPLANT
TAPE CLOTH SURG 4X10 WHT LF (GAUZE/BANDAGES/DRESSINGS) ×2 IMPLANT

## 2012-03-26 SURGICAL SUPPLY — 3 items
CLOTH BEACON ORANGE TIMEOUT ST (SAFETY) ×2 IMPLANT
KIT ROOM TURNOVER OR (KITS) ×2 IMPLANT
PAD ARMBOARD 7.5X6 YLW CONV (MISCELLANEOUS) ×4 IMPLANT

## 2012-03-26 NOTE — Progress Notes (Signed)
Op note [phone]
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
## 2012-03-26 NOTE — Anesthesia Postprocedure Evaluation (Signed)
  Anesthesia Post-op Note  Patient: Jesse Ruiz  Procedure(s) Performed: Procedure(s) (LRB) with comments: CARPAL TUNNEL RELEASE (Right) - incision and drainage right wrist; s/p carpal tunnel release  Patient Location: PACU  Anesthesia Type: MAC  Level of Consciousness: awake, alert , oriented and patient cooperative  Airway and Oxygen Therapy: Patient Spontanous Breathing  Post-op Pain: none  Post-op Assessment: Post-op Vital signs reviewed, Patient's Cardiovascular Status Stable, Respiratory Function Stable, Patent Airway, No signs of Nausea or vomiting and Pain level controlled  Post-op Vital Signs: Reviewed and stable  Complications: No apparent anesthesia complications

## 2012-03-26 NOTE — Transfer of Care (Signed)
Immediate Anesthesia Transfer of Care Note  Patient: Jesse Ruiz  Procedure(s) Performed: Procedure(s) (LRB) with comments: CARPAL TUNNEL RELEASE (Right) - incision and drainage right wrist; s/p carpal tunnel release  Patient Location: PACU  Anesthesia Type: local by surgeon  Level of Consciousness: awake, alert  and oriented  Airway & Oxygen Therapy: Patient Spontanous Breathing  Post-op Assessment: Report given to PACU RN, Post -op Vital signs reviewed and stable and Patient moving all extremities X 4  Post vital signs: Reviewed and stable  Complications: No apparent anesthesia complications

## 2012-03-26 NOTE — Preoperative (Signed)
Beta Blockers   Reason not to administer Beta Blockers:Not Applicable 
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
## 2012-03-26 NOTE — ED Notes (Signed)
Rt. Hand swollen - incision mid palm/wrist. Had carpal tunnel surgery last Tuesday. Now swollen. Cms intact.

## 2012-03-26 NOTE — Progress Notes (Signed)
Orthopedic Tech Progress Note Patient Details:  Jesse Ruiz [birth year] [id number] kuzman sling order by Dr. Jeral Fruit. Ortho Devices Type of Ortho Device: Other (comment) (kuzman sling (elevator)) Ortho Device/Splint Location: (R) UE Ortho Device/Splint Interventions: Application   [name] 03/26/2012, 3:56 PM

## 2012-03-27 ENCOUNTER — Encounter (HOSPITAL_COMMUNITY): Payer: Self-pay | Admitting: General Practice

## 2012-03-27 MED ORDER — CEPHALEXIN 500 MG PO CAPS: 500.0000 mg | ORAL_CAPSULE | Freq: Four times a day (QID) | ORAL | Status: AC

## 2012-03-27 MED ORDER — IBUPROFEN 400 MG PO TABS
400.0000 mg | ORAL_TABLET | Freq: Four times a day (QID) | ORAL | Status: DC | PRN
  Administered 2012-03-27: 400 mg via ORAL
  Filled 2012-03-27: qty 1

## 2012-03-27 MED ORDER — CYCLOBENZAPRINE HCL 10 MG PO TABS: 10.0000 mg | ORAL_TABLET | Freq: Three times a day (TID) | ORAL | Status: DC | PRN

## 2012-03-27 NOTE — Discharge Summary (Signed)
Physician Discharge Summary  Patient ID: Jesse Ruiz MRN: [id number] DOB/AGE: 12-08-1965 46 y.o.  Admit date: 03/26/2012 Discharge date: 03/27/2012  Admission Diagnoses: wound infection    Discharge Diagnoses: same   Discharged Condition: good  Hospital Course: The patient was admitted on 03/26/2012 and taken to the operating room where the patient underwent I and D carpal tunnel wound. The patient tolerated the procedure well and was taken to the recovery room and then to the floor in stable condition. He was treated with kefzol.The hospital course was routine. There were no complications. The wound remained clean dry and intact. Pt had appropriate hand soreness. No complaints of new pain or new N/T/W. The patient remained afebrile with stable vital signs, and tolerated a regular diet. The patient continued to increase activities, and pain was well controlled with oral pain medications.   Consults: None  Significant Diagnostic Studies:  Results for orders placed during the hospital encounter of 03/26/12  WOUND CULTURE      Component Value Range   Specimen Description WOUND RIGHT WRIST     Special Requests NONE     Gram Stain       Value: RARE WBC PRESENT, PREDOMINANTLY PMN     NO SQUAMOUS EPITHELIAL CELLS SEEN     NO ORGANISMS SEEN   Culture NO GROWTH     Report Status PENDING      Dg Chest 2 View  03/10/2012  *RADIOLOGY REPORT*  Clinical Data: Preop for cervical surgery.  CHEST - 2 VIEW  Comparison: None.  Findings: Cardiomediastinal silhouette appears normal.  No acute pulmonary disease is noted.  Bony thorax is intact.  IMPRESSION: No acute cardiopulmonary abnormality seen.   Original Report Authenticated By: Venita Sheffield., M.D.    Dg Cervical Spine 2-3 Views  03/15/2012  *RADIOLOGY REPORT*  Clinical Data: C7-T1 posterior laminectomy.  CERVICAL SPINE - 2-3 VIEW  Technique: Two intraoperative fluoro spot images are submitted.  Comparison:  None.  Findings: The C3 and C4  vertebral bodies are fused anteriorly and posteriorly.  The second image demonstrates a surgical probe posteriorly at the level of C6-7.  The cervicothoracic junction is significantly oblique on this image.  IMPRESSION: Intraoperative localization of the posterior elements at C6-7.   Original Report Authenticated By: Jamesetta Orleans. MATTERN, M.D.    Dg C-arm 1-60 Min  03/15/2012  *RADIOLOGY REPORT*  Clinical Data: C7-T1 posterior laminectomy.  CERVICAL SPINE - 2-3 VIEW  Technique: Two intraoperative fluoro spot images are submitted.  Comparison:  None.  Findings: The C3 and C4 vertebral bodies are fused anteriorly and posteriorly.  The second image demonstrates a surgical probe posteriorly at the level of C6-7.  The cervicothoracic junction is significantly oblique on this image.  IMPRESSION: Intraoperative localization of the posterior elements at C6-7.   Original Report Authenticated By: Jamesetta Orleans. MATTERN, M.D.     Antibiotics:  Anti-infectives     Start     Dose/Rate Route Frequency Ordered Stop   03/27/12 0000   cephALEXin (KEFLEX) 500 MG capsule        500 mg Oral 4 times daily 03/27/12 1257 04/06/12 2359   03/26/12 1830   ceFAZolin (ANCEF) IVPB 1 g/50 mL premix        1 g 100 mL/hr over 30 Minutes Intravenous Every 8 hours 03/26/12 1606 03/27/12 0554          Discharge Exam: Blood pressure 124/61, pulse 80, temperature 98 F (36.7 C), temperature source Oral, resp. rate  19, height 5\' 11"  (1.803 m), weight 106.1 kg (233 lb 14.5 oz), SpO2 99.00%. Neurologic: Grossly normal   Discharge Medications:   Medication List  As of 03/27/2012 12:57 PM   TAKE these medications         cephALEXin 500 MG capsule   Commonly known as: KEFLEX   Take 1 capsule (500 mg total) by mouth 4 (four) times daily.      fish oil-omega-3 fatty acids 1000 MG capsule   Take 1 g by mouth daily.      HYDROcodone-acetaminophen 5-325 MG per tablet   Commonly known as: NORCO/VICODIN   Take 1-2 tablets by  mouth every 4 (four) hours as needed for pain. For pain      ibuprofen 200 MG tablet   Commonly known as: ADVIL,MOTRIN   Take 400 mg by mouth every 6 (six) hours as needed. For pain         ASK your doctor about these medications         cyclobenzaprine 10 MG tablet   Commonly known as: FLEXERIL   Take 1 tablet (10 mg total) by mouth 3 (three) times daily as needed for muscle spasms.            Disposition: home   Final Dx: I and D hand wound  Discharge Orders    Future Orders Please Complete By Expires   Diet - low sodium heart healthy      Increase activity slowly      Discharge instructions      Comments:   KEEP HAND DRY AND CLEAN   Call MD for:  temperature >100.4      Call MD for:  persistant nausea and vomiting      Call MD for:  severe uncontrolled pain      Call MD for:  redness, tenderness, or signs of infection (pain, swelling, redness, odor or green/yellow discharge around incision site)      Call MD for:  difficulty breathing, headache or visual disturbances         Follow-up Information    Follow up with Rilynn Habel S, MD in 1 week.   Contact information:   [address] [place] [address] [phone]           Signed: Tia Alert 03/27/2012, 12:57 PM

## 2012-03-27 NOTE — Op Note (Signed)
NAMEDONNA, Jesse Ruiz                  ACCOUNT NO.:  [id number]  MEDICAL RECORD NO.:  [id number]  LOCATION:  [id number]                        FACILITY:  [facility]  PHYSICIAN:  Hilda Lias, M.D.   DATE OF BIRTH:  1966-05-06  DATE OF PROCEDURE:  03/26/2012 DATE OF DISCHARGE:  03/26/2012                              OPERATIVE REPORT   PREOPERATIVE DIAGNOSIS:  Infection of the right carpal tunnel surgery wound.  POSTOPERATIVE DIAGNOSIS:  Infection of the right carpal tunnel surgery wound.  PROCEDURE:  I and D of the right hand wound.  ANESTHESIA:  Local.  SURGEON:  Hilda Lias, M.D.  CLINICAL HISTORY:  [name] is a 46 year old gentleman who underwent decompression of the right median nerve almost a week ago.  Since Friday, he had been noticed some swelling.  He was seen in the [facility] Emergency Room yesterday.  He was given some p.o. antibiotics. Today, he called because the pain was getting worse and he has little swelling going all the way to the elbow.  I saw him in the emergency room.  He was immediately admitted.  Clinically, he has pus coming from the wound with quite a bit of swelling, edema, and redness going up to the elbow.  PROCEDURE:  The patient was taken to the OR.  Unfortunately prior to surgery he had some small bites.  Because of that, the surgery was done under local.  After he was taken to the OR, the right hand and arm were prepped with DuraPrep.  Drapes were applied.  We infiltrated proximal to the median nerve.  Then we removed the 3 stitches, immediately pus came. Anaerobic and anaerobic culture was taken.  Then, we removed the 2 stitches in the subcutaneous space, more pus was removed from proximal. The area was irrigated with peroxide.  At the end, we had good drainage. The wound was packed slightly and dressing was applied.  He is going to go to the main hospital for admission at least overnight.  We are going to keep the right arm elevated and start  him on antibiotics.          ______________________________ Hilda Lias, M.D.     EB/MEDQ  D:  03/26/2012  T:  03/27/2012  Job:  [id number]

## 2012-03-27 NOTE — Progress Notes (Signed)
Patient was discharge but AVS could not be printed and he did not have the patience to wait for MD to fix it so he requested i read the instructions for him, answered all questions and was sent home.assessments remained unchanged prior to discharge

## 2012-03-28 NOTE — Care Management Note (Signed)
    Page 1 of 1   03/28/2012     8:55:12 AM   CARE MANAGEMENT NOTE 03/28/2012  Patient:  Jesse Ruiz, Jesse Ruiz   Account Number:  [id number]  Date Initiated:  03/27/2012  Documentation initiated by:  [facility]  Subjective/Objective Assessment:   Admitted with infected rt hand wound  infection post carpel tunnel surgery.     Action/Plan:   had I and D on 03/27/12   Anticipated DC Date:  03/30/2012   Anticipated DC Plan:  HOME/SELF CARE      DC Planning Services  CM consult      Choice offered to / List presented to:             Status of service:  Completed, signed off Medicare Important Message given?   (If response is "NO", the following Medicare IM given date fields will be blank) Date Medicare IM given:   Date Additional Medicare IM given:    Discharge Disposition:  HOME/SELF CARE  Per UR Regulation:  Reviewed for med. necessity/level of care/duration of stay  If discussed at Long Length of Stay Meetings, dates discussed:    Comments:

## 2012-03-29 LAB — WOUND CULTURE

## 2012-03-31 LAB — ANAEROBIC CULTURE

## 2014-08-01 ENCOUNTER — Encounter (HOSPITAL_COMMUNITY): Payer: Self-pay | Admitting: Neurosurgery

## 2015-11-12 ENCOUNTER — Other Ambulatory Visit: Payer: Self-pay | Admitting: Occupational Medicine

## 2015-11-12 ENCOUNTER — Ambulatory Visit: Payer: Self-pay

## 2015-11-12 DIAGNOSIS — Z0000 Encounter for general adult medical examination without abnormal findings: Secondary | ICD-10-CM

## 2018-07-19 HISTORY — PX: CARPAL TUNNEL RELEASE: SHX101

## 2018-09-25 ENCOUNTER — Other Ambulatory Visit: Payer: Self-pay | Admitting: Orthopedic Surgery

## 2018-10-23 ENCOUNTER — Ambulatory Visit (HOSPITAL_COMMUNITY): Admit: 2018-10-23 | Payer: Self-pay | Admitting: Orthopedic Surgery

## 2018-11-21 ENCOUNTER — Other Ambulatory Visit: Payer: Self-pay | Admitting: Orthopedic Surgery

## 2018-11-23 ENCOUNTER — Other Ambulatory Visit: Payer: Self-pay | Admitting: Orthopedic Surgery

## 2018-11-23 ENCOUNTER — Other Ambulatory Visit: Payer: Self-pay

## 2018-11-23 ENCOUNTER — Encounter (HOSPITAL_COMMUNITY): Payer: Self-pay

## 2018-11-23 ENCOUNTER — Encounter (HOSPITAL_COMMUNITY)
Admission: RE | Admit: 2018-11-23 | Discharge: 2018-11-23 | Disposition: A | Discharge status: Home / Self Care | Payer: No Typology Code available for payment source | Source: Ambulatory Visit | Attending: Orthopedic Surgery | Admitting: Orthopedic Surgery

## 2018-11-23 ENCOUNTER — Other Ambulatory Visit (HOSPITAL_COMMUNITY)
Admission: RE | Admit: 2018-11-23 | Discharge: 2018-11-23 | Disposition: A | Discharge status: Home / Self Care | Payer: HRSA Program | Source: Ambulatory Visit | Attending: Orthopedic Surgery | Admitting: Orthopedic Surgery

## 2018-11-23 DIAGNOSIS — Z01818 Encounter for other preprocedural examination: Secondary | ICD-10-CM | POA: Insufficient documentation

## 2018-11-23 DIAGNOSIS — M1712 Unilateral primary osteoarthritis, left knee: Secondary | ICD-10-CM | POA: Diagnosis present

## 2018-11-23 DIAGNOSIS — Z1159 Encounter for screening for other viral diseases: Secondary | ICD-10-CM | POA: Diagnosis present

## 2018-11-23 HISTORY — DX: Elevated blood-pressure reading, without diagnosis of hypertension: R03.0

## 2018-11-23 HISTORY — DX: Simple chronic bronchitis: J41.0

## 2018-11-23 HISTORY — DX: Presence of spectacles and contact lenses: Z97.3

## 2018-11-23 HISTORY — DX: Unspecified osteoarthritis, unspecified site: M19.90

## 2018-11-23 HISTORY — DX: Personal history of traumatic brain injury: Z87.820

## 2018-11-23 HISTORY — DX: Personal history of pulmonary embolism: Z86.711

## 2018-11-23 LAB — CBC WITH DIFFERENTIAL/PLATELET
Abs Immature Granulocytes: 0.02 10*3/uL (ref 0.00–0.07)
Basophils Absolute: 0.1 10*3/uL (ref 0.0–0.1)
Basophils Relative: 1 %
Eosinophils Absolute: 0.3 10*3/uL (ref 0.0–0.5)
Eosinophils Relative: 4 %
HCT: 49.2 % (ref 39.0–52.0)
Hemoglobin: 16.9 g/dL (ref 13.0–17.0)
Immature Granulocytes: 0 %
Lymphocytes Relative: 25 %
Lymphs Abs: 2.2 10*3/uL (ref 0.7–4.0)
MCH: 31.6 pg (ref 26.0–34.0)
MCHC: 34.3 g/dL (ref 30.0–36.0)
MCV: 92 fL (ref 80.0–100.0)
Monocytes Absolute: 0.7 10*3/uL (ref 0.1–1.0)
Monocytes Relative: 8 %
Neutro Abs: 5.4 10*3/uL (ref 1.7–7.7)
Neutrophils Relative %: 62 %
Platelets: 165 10*3/uL (ref 150–400)
RBC: 5.35 MIL/uL (ref 4.22–5.81)
RDW: 11.7 % (ref 11.5–15.5)
WBC: 8.6 10*3/uL (ref 4.0–10.5)
nRBC: 0 % (ref 0.0–0.2)

## 2018-11-23 LAB — URINALYSIS, ROUTINE W REFLEX MICROSCOPIC
Bilirubin Urine: NEGATIVE
Glucose, UA: NEGATIVE mg/dL
Ketones, ur: NEGATIVE mg/dL
Leukocytes,Ua: NEGATIVE
Nitrite: NEGATIVE
Protein, ur: NEGATIVE mg/dL
Specific Gravity, Urine: 1.021 (ref 1.005–1.030)
pH: 5 (ref 5.0–8.0)

## 2018-11-23 LAB — BASIC METABOLIC PANEL
Anion gap: 7 (ref 5–15)
BUN: 17 mg/dL (ref 6–20)
CO2: 23 mmol/L (ref 22–32)
Calcium: 9.5 mg/dL (ref 8.9–10.3)
Chloride: 105 mmol/L (ref 98–111)
Creatinine, Ser: 0.72 mg/dL (ref 0.61–1.24)
GFR calc Af Amer: >60 mL/min (ref >60)
GFR calc non Af Amer: >60 mL/min (ref >60)
Glucose, Bld: 84 mg/dL (ref 70–99)
Potassium: 3.9 mmol/L (ref 3.5–5.1)
Sodium: 135 mmol/L (ref 135–145)

## 2018-11-23 LAB — PROTIME-INR
INR: 1 (ref 0.8–1.2)
Prothrombin Time: 13.2 seconds (ref 11.4–15.2)

## 2018-11-23 LAB — APTT: aPTT: 25 seconds (ref 24–36)

## 2018-11-23 LAB — SURGICAL PCR SCREEN
MRSA, PCR: NEGATIVE
Staphylococcus aureus: NEGATIVE

## 2018-11-23 LAB — ABO/RH: ABO/RH(D): B POS
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
## 2018-11-23 NOTE — Patient Instructions (Signed)
CARY LOTHROP  11/23/2018       Your procedure is scheduled on:  11-27-2018   Report to [facility] Main  Entrance, Report to Short Stay at  5:30 AM    Call this number if you have problems the morning of surgery [phone]     AFTER THIS PAT APPOINTMENT YOU ARE TO DRIVE UP TO  [facility] ENTRANCE TO GET A NASAL COVID 19 TEST DONE, FROM 11:00 AM TO 3:00 PM.   UNTIL YOUR SURGERY AFTER COVID TEST YOU ARE TO GO STRAIGHT HOME, DO NOT STOP ANYWHERE.  YOU ARE NOT GO ANYWHERE AFTER YOU ARE HOME UNTIL SURGERY UNLESS YOU HAVE A MEDICAL APPOINTMENT OR EMERGENCY.      Remember: NO SOLID FOOD AFTER MIDNIGHT THE NIGHT PRIOR TO SURGERY. NOTHING BY MOUTH EXCEPT CLEAR LIQUIDS UNTIL 4:30 AM.  PLEASE FINISH ENSURE PRE-SURGERY DRINK PER SURGEON ORDER ,  WHICH NEEDS TO BE COMPLETED AT  4:30 AM.  NOTHING BY MOUTH AFTER 4:30 AM INCLUDING WATER, CANDY, GUM, MINTS  BRUSH YOUR TEETH MORNING OF SURGERY AND RINSE YOUR MOUTH OUT.       Take these medicines the morning of surgery with A SIP OF WATER:   NONE                                 You may not have any metal on your body including piercings              Do not wear jewelry,  lotions, powders or perfumes, deodorant                           Men may shave face and neck.     Do not bring valuables to the hospital. [facility] IS NOT             RESPONSIBLE   FOR VALUABLES.  Contacts, dentures or bridgework may not be worn into surgery.     _____________________________________________________________________     CLEAR LIQUID DIET   Foods Allowed                                                                     Foods Excluded  Coffee and tea, regular and decaf                             liquids that you cannot  Plain Jell-O in any flavor                                             see through such as: Fruit ices (not with fruit pulp)                                     milk, soups,  orange juice  Iced Popsicles  All solid food Carbonated beverages, regular and diet                                    Cranberry, grape and apple juices Sports drinks like Gatorade Lightly seasoned clear broth or consume(fat free) Sugar, honey syrup    _____________________________________________________________________             [facility] Pre[facility] Before surgery, you can play an important role.  Because skin is not sterile, your skin needs to be as free of germs as possible.  You can reduce the number of germs on your skin by washing with CHG (chlorahexidine gluconate) soap before surgery.  CHG is an antiseptic cleaner which kills germs and bonds with the skin to continue killing germs even after washing. Please DO NOT use if you have an allergy to CHG or antibacterial soaps.  If your skin becomes reddened/irritated stop using the CHG and inform your nurse when you arrive at Short Stay. Do not shave (including legs and underarms) for at least 48 hours prior to the first CHG shower.  You may shave your face/neck. Please follow these instructions carefully:  1.  Shower with CHG Soap the night before surgery and the  morning of Surgery.  2.  If you choose to wash your hair, wash your hair first as usual with your  normal  shampoo.  3.  After you shampoo, rinse your hair and body thoroughly to remove the  shampoo.                            4.  Use CHG as you would any other liquid soap.  You can apply chg directly  to the skin and wash                       Gently with a scrungie or clean washcloth.  5.  Apply the CHG Soap to your body ONLY FROM THE NECK DOWN.   Do not use on face/ open                           Wound or open sores. Avoid contact with eyes, ears mouth and genitals (private parts).                       Wash face,  Genitals (private parts) with your normal soap.             6.  Wash thoroughly, paying special attention to  the area where your surgery  will be performed.  7.  Thoroughly rinse your body with warm water from the neck down.  8.  DO NOT shower/wash with your normal soap after using and rinsing off  the CHG Soap.             9.  Pat yourself dry with a clean towel.            10.  Wear clean pajamas.            11.  Place clean sheets on your bed the night of your first shower and do not  sleep with pets. Day of Surgery : Do not apply any lotions/deodorants the morning of surgery.  Please wear clean clothes to the hospital/surgery center.  FAILURE TO  FOLLOW THESE INSTRUCTIONS MAY RESULT IN THE CANCELLATION OF YOUR SURGERY PATIENT SIGNATURE_________________________________  NURSE SIGNATURE__________________________________  ________________________________________________________________________   Rogelia Mire  An incentive spirometer is a tool that can help keep your lungs clear and active. This tool measures how well you are filling your lungs with each breath. Taking long deep breaths may help reverse or decrease the chance of developing breathing (pulmonary) problems (especially infection) following:  A long period of time when you are unable to move or be active. BEFORE THE PROCEDURE   If the spirometer includes an indicator to show your best effort, your nurse or respiratory therapist will set it to a desired goal.  If possible, sit up straight or lean slightly forward. Try not to slouch.  Hold the incentive spirometer in an upright position. INSTRUCTIONS FOR USE  1. Sit on the edge of your bed if possible, or sit up as far as you can in bed or on a chair. 2. Hold the incentive spirometer in an upright position. 3. Breathe out normally. 4. Place the mouthpiece in your mouth and seal your lips tightly around it. 5. Breathe in slowly and as deeply as possible, raising the piston or the ball toward the top of the column. 6. Hold your breath for 3-5 seconds or for as long as  possible. Allow the piston or ball to fall to the bottom of the column. 7. Remove the mouthpiece from your mouth and breathe out normally. 8. Rest for a few seconds and repeat Steps 1 through 7 at least 10 times every 1-2 hours when you are awake. Take your time and take a few normal breaths between deep breaths. 9. The spirometer may include an indicator to show your best effort. Use the indicator as a goal to work toward during each repetition. 10. After each set of 10 deep breaths, practice coughing to be sure your lungs are clear. If you have an incision (the cut made at the time of surgery), support your incision when coughing by placing a pillow or rolled up towels firmly against it. Once you are able to get out of bed, walk around indoors and cough well. You may stop using the incentive spirometer when instructed by your caregiver.  RISKS AND COMPLICATIONS  Take your time so you do not get dizzy or light-headed.  If you are in pain, you may need to take or ask for pain medication before doing incentive spirometry. It is harder to take a deep breath if you are having pain. AFTER USE  Rest and breathe slowly and easily.  It can be helpful to keep track of a log of your progress. Your caregiver can provide you with a simple table to help with this. If you are using the spirometer at home, follow these instructions: SEEK MEDICAL CARE IF:   You are having difficultly using the spirometer.  You have trouble using the spirometer as often as instructed.  Your pain medication is not giving enough relief while using the spirometer.  You develop fever of 100.5 F (38.1 C) or higher. SEEK IMMEDIATE MEDICAL CARE IF:   You cough up bloody sputum that had not been present before.  You develop fever of 102 F (38.9 C) or greater.  You develop worsening pain at or near the incision site. MAKE SURE YOU:   Understand these instructions.  Will watch your condition.  Will get help right  away if you are not doing well or get worse. Document Released: 11/15/2006 Document Revised:  09/27/2011 Document Reviewed: 01/16/2007 Exit[employer] Patient Information 2014 Exit[employer], [place].   ________________________________________________________________________  WHAT IS A BLOOD TRANSFUSION? Blood Transfusion Information  A transfusion is the replacement of blood or some of its parts. Blood is made up of multiple cells which provide different functions.  Red blood cells carry oxygen and are used for blood loss replacement.  White blood cells fight against infection.  Platelets control bleeding.  Plasma helps clot blood.  Other blood products are available for specialized needs, such as hemophilia or other clotting disorders. BEFORE THE TRANSFUSION  Who gives blood for transfusions?   Healthy volunteers who are fully evaluated to make sure their blood is safe. This is blood bank blood. Transfusion therapy is the safest it has ever been in the practice of medicine. Before blood is taken from a donor, a complete history is taken to make sure that person has no history of diseases nor engages in risky social behavior (examples are intravenous drug use or sexual activity with multiple partners). The donor's travel history is screened to minimize risk of transmitting infections, such as malaria. The donated blood is tested for signs of infectious diseases, such as HIV and hepatitis. The blood is then tested to be sure it is compatible with you in order to minimize the chance of a transfusion reaction. If you or a relative donates blood, this is often done in anticipation of surgery and is not appropriate for emergency situations. It takes many days to process the donated blood. RISKS AND COMPLICATIONS Although transfusion therapy is very safe and saves many lives, the main dangers of transfusion include:   Getting an infectious disease.  Developing a transfusion reaction. This is an allergic  reaction to something in the blood you were given. Every precaution is taken to prevent this. The decision to have a blood transfusion has been considered carefully by your caregiver before blood is given. Blood is not given unless the benefits outweigh the risks. AFTER THE TRANSFUSION  Right after receiving a blood transfusion, you will usually feel much better and more energetic. This is especially true if your red blood cells have gotten low (anemic). The transfusion raises the level of the red blood cells which carry oxygen, and this usually causes an energy increase.  The nurse administering the transfusion will monitor you carefully for complications. HOME CARE INSTRUCTIONS  No special instructions are needed after a transfusion. You may find your energy is better. Speak with your caregiver about any limitations on activity for underlying diseases you may have. SEEK MEDICAL CARE IF:   Your condition is not improving after your transfusion.  You develop redness or irritation at the intravenous (IV) site. SEEK IMMEDIATE MEDICAL CARE IF:  Any of the following symptoms occur over the next 12 hours:  Shaking chills.  You have a temperature by mouth above 102 F (38.9 C), not controlled by medicine.  Chest, back, or muscle pain.  People around you feel you are not acting correctly or are confused.  Shortness of breath or difficulty breathing.  Dizziness and fainting.  You get a rash or develop hives.  You have a decrease in urine output.  Your urine turns a dark color or changes to pink, red, or brown. Any of the following symptoms occur over the next 10 days:  You have a temperature by mouth above 102 F (38.9 C), not controlled by medicine.  Shortness of breath.  Weakness after normal activity.  The white part of the eye turns yellow (  jaundice).  You have a decrease in the amount of urine or are urinating less often.  Your urine turns a dark color or changes to pink,  red, or brown. Document Released: 07/02/2000 Document Revised: 09/27/2011 Document Reviewed: 02/19/2008 [facility] Patient Information 2014 [place], [place].  _______________________________________________________________________
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
## 2018-11-23 NOTE — Anesthesia Preprocedure Evaluation (Addendum)
Anesthesia Evaluation  Patient identified by MRN, date of birth, ID band Patient awake    Reviewed: Allergy & Precautions, NPO status , Patient's Chart, lab work & pertinent test results  Airway Mallampati: II  TM Distance: >3 FB     Dental   Pulmonary Current Smoker,    breath sounds clear to auscultation       Cardiovascular negative cardio ROS   Rhythm:Regular Rate:Normal     Neuro/Psych Hx TBI negative neurological ROS     GI/Hepatic negative GI ROS, Neg liver ROS,   Endo/Other  Morbid obesity  Renal/GU negative Renal ROS     Musculoskeletal  (+) Arthritis ,   Abdominal   Peds  Hematology negative hematology ROS (+)   Anesthesia Other Findings   Reproductive/Obstetrics                           Lab Results  Component Value Date   WBC 8.6 11/23/2018   HGB 16.9 11/23/2018   HCT 49.2 11/23/2018   MCV 92.0 11/23/2018   PLT 165 11/23/2018   Lab Results  Component Value Date   CREATININE 0.72 11/23/2018   BUN 17 11/23/2018   NA 135 11/23/2018   K 3.9 11/23/2018   CL 105 11/23/2018   CO2 23 11/23/2018   Lab Results  Component Value Date   INR 1.0 11/23/2018   INR 0.95 03/10/2012    Anesthesia Physical Anesthesia Plan  ASA: II  Anesthesia Plan: Spinal and MAC   Post-op Pain Management:  Regional for Post-op pain   Induction: Intravenous  PONV Risk Score and Plan: 0 and Propofol infusion, Ondansetron and Treatment may vary due to age or medical condition  Airway Management Planned: Natural Airway and Simple Face Mask  Additional Equipment:   Intra-op Plan:   Post-operative Plan:   Informed Consent: I have reviewed the patients History and Physical, chart, labs and discussed the procedure including the risks, benefits and alternatives for the proposed anesthesia with the patient or authorized representative who has indicated his/her understanding and acceptance.        Plan Discussed with: CRNA  Anesthesia Plan Comments:       Anesthesia Quick Evaluation

## 2018-11-23 NOTE — Progress Notes (Addendum)
Final EKG pending. PCR pending.  Pt BP at pat appointment today was 158/102 right arm. Pt stated has not been diagnosed before with hypertension. Chart to anesthesia for review, Jodell Cipro PA.  ADDENDUM:  Final EKG pending. PCR result complete.

## 2018-11-24 LAB — NOVEL CORONAVIRUS, NAA (HOSP ORDER, SEND-OUT TO REF LAB; TAT 18-24 HRS): SARS-CoV-2, NAA: NOT DETECTED

## 2018-11-24 NOTE — Progress Notes (Signed)
SPOKE W/  Patient via phone     SCREENING SYMPTOMS OF COVID 19:   COUGH--no  RUNNY NOSE--- no  SORE THROAT---no  NASAL CONGESTION----no  SNEEZING----no  SHORTNESS OF BREATH---no  DIFFICULTY BREATHING---no  TEMP >100.0 -----no  UNEXPLAINED BODY ACHES------no  CHILLS -------- no  HEADACHES ---------no  LOSS OF SMELL/ TASTE --------no    HAVE YOU OR ANY FAMILY MEMBER TRAVELLED PAST 14 DAYS OUT OF THE   COUNTY---no STATE----no COUNTRY----no  HAVE YOU OR ANY FAMILY MEMBER BEEN EXPOSED TO ANYONE WITH COVID 19? no     

## 2018-11-26 MED ORDER — DEXTROSE 5 % IV SOLN
3.0000 g | INTRAVENOUS | Status: AC
  Administered 2018-11-27: 3 g via INTRAVENOUS
  Filled 2018-11-26: qty 3

## 2018-11-26 MED ORDER — BUPIVACAINE LIPOSOME 1.3 % IJ SUSP
20.0000 mL | Freq: Once | INTRAMUSCULAR | Status: DC
  Filled 2018-11-26: qty 20

## 2018-11-26 MED ORDER — TRANEXAMIC ACID 1000 MG/10ML IV SOLN
2000.0000 mg | INTRAVENOUS | Status: DC
  Filled 2018-11-26 (×2): qty 20

## 2018-11-27 ENCOUNTER — Ambulatory Visit (HOSPITAL_COMMUNITY): Payer: No Typology Code available for payment source | Admitting: Physician Assistant

## 2018-11-27 ENCOUNTER — Encounter (HOSPITAL_COMMUNITY): Payer: Self-pay

## 2018-11-27 ENCOUNTER — Encounter (HOSPITAL_COMMUNITY): Payer: Self-pay | Admitting: *Deleted

## 2018-11-27 ENCOUNTER — Other Ambulatory Visit: Payer: Self-pay

## 2018-11-27 ENCOUNTER — Ambulatory Visit (HOSPITAL_COMMUNITY)
Admission: RE | Admit: 2018-11-27 | Discharge: 2018-11-29 | Disposition: A | Discharge status: Home / Self Care | Payer: No Typology Code available for payment source | Attending: Orthopedic Surgery | Admitting: Orthopedic Surgery

## 2018-11-27 ENCOUNTER — Encounter (HOSPITAL_COMMUNITY)
Admission: RE | Disposition: A | Discharge status: Home / Self Care | Payer: Self-pay | Source: Home / Self Care | Attending: Orthopedic Surgery

## 2018-11-27 DIAGNOSIS — F17210 Nicotine dependence, cigarettes, uncomplicated: Secondary | ICD-10-CM | POA: Insufficient documentation

## 2018-11-27 DIAGNOSIS — Z6839 Body mass index (BMI) 39.0-39.9, adult: Secondary | ICD-10-CM | POA: Diagnosis not present

## 2018-11-27 DIAGNOSIS — Z96652 Presence of left artificial knee joint: Secondary | ICD-10-CM

## 2018-11-27 DIAGNOSIS — M1712 Unilateral primary osteoarthritis, left knee: Secondary | ICD-10-CM | POA: Diagnosis not present

## 2018-11-27 HISTORY — PX: TOTAL KNEE ARTHROPLASTY: SHX125

## 2018-11-27 LAB — TYPE AND SCREEN
ABO/RH(D): B POS
Antibody Screen: NEGATIVE

## 2018-11-27 SURGERY — ARTHROPLASTY, KNEE, TOTAL
Anesthesia: Monitor Anesthesia Care | Laterality: Left

## 2018-11-27 SURGERY — ARTHROPLASTY, KNEE, TOTAL
Anesthesia: Spinal | Laterality: Left

## 2018-11-27 MED ORDER — PROPOFOL 500 MG/50ML IV EMUL
INTRAVENOUS | Status: DC | PRN
  Administered 2018-11-27: 100 ug/kg/min via INTRAVENOUS

## 2018-11-27 MED ORDER — HYDROMORPHONE HCL 2 MG PO TABS
2.0000 mg | ORAL_TABLET | ORAL | Status: DC | PRN
  Administered 2018-11-27 – 2018-11-28 (×3): 2 mg via ORAL
  Filled 2018-11-27 (×3): qty 1

## 2018-11-27 MED ORDER — ALBUTEROL SULFATE HFA 108 (90 BASE) MCG/ACT IN AERS
INHALATION_SPRAY | RESPIRATORY_TRACT | Status: DC | PRN
  Administered 2018-11-27: 4 via RESPIRATORY_TRACT

## 2018-11-27 MED ORDER — KCL IN DEXTROSE-NACL 20-5-0.45 MEQ/L-%-% IV SOLN
INTRAVENOUS | Status: DC
  Administered 2018-11-27 – 2018-11-28 (×2): via INTRAVENOUS
  Filled 2018-11-27 (×4): qty 1000

## 2018-11-27 MED ORDER — ASPIRIN EC 81 MG PO TBEC: 81.0000 mg | DELAYED_RELEASE_TABLET | Freq: Two times a day (BID) | ORAL | 0 refills | Status: DC

## 2018-11-27 MED ORDER — METOCLOPRAMIDE HCL 5 MG PO TABS: 5.0000 mg | ORAL_TABLET | Freq: Three times a day (TID) | ORAL | Status: DC | PRN

## 2018-11-27 MED ORDER — WATER FOR IRRIGATION, STERILE IR SOLN
Status: DC | PRN
  Administered 2018-11-27: 2000 mL

## 2018-11-27 MED ORDER — ASPIRIN 81 MG PO CHEW
81.0000 mg | CHEWABLE_TABLET | Freq: Two times a day (BID) | ORAL | Status: DC
  Administered 2018-11-27 – 2018-11-29 (×4): 81 mg via ORAL
  Filled 2018-11-27 (×4): qty 1

## 2018-11-27 MED ORDER — ONDANSETRON HCL 4 MG/2ML IJ SOLN
INTRAMUSCULAR | Status: DC | PRN
  Administered 2018-11-27: 4 mg via INTRAVENOUS

## 2018-11-27 MED ORDER — BUPIVACAINE LIPOSOME 1.3 % IJ SUSP
INTRAMUSCULAR | Status: DC | PRN
  Administered 2018-11-27 (×2): 5 mL
  Administered 2018-11-27: 10 mL

## 2018-11-27 MED ORDER — FENTANYL CITRATE (PF) 100 MCG/2ML IJ SOLN
INTRAMUSCULAR | Status: DC | PRN
  Administered 2018-11-27: 100 ug via INTRAVENOUS

## 2018-11-27 MED ORDER — PROPOFOL 10 MG/ML IV BOLUS
INTRAVENOUS | Status: DC | PRN
  Administered 2018-11-27 (×3): 20 mg via INTRAVENOUS

## 2018-11-27 MED ORDER — ALBUTEROL SULFATE HFA 108 (90 BASE) MCG/ACT IN AERS
INHALATION_SPRAY | RESPIRATORY_TRACT | Status: AC
  Filled 2018-11-27: qty 6.7

## 2018-11-27 MED ORDER — MIDAZOLAM HCL 2 MG/2ML IJ SOLN
INTRAMUSCULAR | Status: AC
  Filled 2018-11-27: qty 2

## 2018-11-27 MED ORDER — GLYCOPYRROLATE 0.2 MG/ML IJ SOLN
INTRAMUSCULAR | Status: DC | PRN
  Administered 2018-11-27: 0.2 mg via INTRAVENOUS

## 2018-11-27 MED ORDER — PROPOFOL 10 MG/ML IV BOLUS
INTRAVENOUS | Status: AC
  Filled 2018-11-27: qty 60

## 2018-11-27 MED ORDER — FLEET ENEMA 7-19 GM/118ML RE ENEM: 1.0000 | ENEMA | Freq: Once | RECTAL | Status: DC | PRN

## 2018-11-27 MED ORDER — ACETAMINOPHEN 325 MG PO TABS
325.0000 mg | ORAL_TABLET | Freq: Four times a day (QID) | ORAL | Status: DC | PRN
  Administered 2018-11-27 – 2018-11-29 (×3): 650 mg via ORAL
  Filled 2018-11-27 (×3): qty 2

## 2018-11-27 MED ORDER — SODIUM CHLORIDE (PF) 0.9 % IJ SOLN
INTRAMUSCULAR | Status: AC
  Filled 2018-11-27: qty 20

## 2018-11-27 MED ORDER — TRANEXAMIC ACID-NACL 1000-0.7 MG/100ML-% IV SOLN
1000.0000 mg | INTRAVENOUS | Status: AC
  Administered 2018-11-27: 1000 mg via INTRAVENOUS
  Filled 2018-11-27: qty 100

## 2018-11-27 MED ORDER — DIPHENHYDRAMINE HCL 12.5 MG/5ML PO ELIX: 12.5000 mg | ORAL_SOLUTION | ORAL | Status: DC | PRN

## 2018-11-27 MED ORDER — MIDAZOLAM HCL 5 MG/5ML IJ SOLN
INTRAMUSCULAR | Status: DC | PRN
  Administered 2018-11-27: 2 mg via INTRAVENOUS

## 2018-11-27 MED ORDER — DEXAMETHASONE SODIUM PHOSPHATE 10 MG/ML IJ SOLN
INTRAMUSCULAR | Status: DC | PRN
  Administered 2018-11-27: 10 mg via INTRAVENOUS

## 2018-11-27 MED ORDER — BUPIVACAINE IN DEXTROSE 0.75-8.25 % IT SOLN
INTRATHECAL | Status: DC | PRN
  Administered 2018-11-27: 2 mL via INTRATHECAL

## 2018-11-27 MED ORDER — MENTHOL 3 MG MT LOZG: 1.0000 | LOZENGE | OROMUCOSAL | Status: DC | PRN

## 2018-11-27 MED ORDER — TRANEXAMIC ACID-NACL 1000-0.7 MG/100ML-% IV SOLN: 1000.0000 mg | Freq: Once | INTRAVENOUS | Status: DC

## 2018-11-27 MED ORDER — PANTOPRAZOLE SODIUM 40 MG PO TBEC
40.0000 mg | DELAYED_RELEASE_TABLET | Freq: Every day | ORAL | Status: DC
  Administered 2018-11-27 – 2018-11-29 (×3): 40 mg via ORAL
  Filled 2018-11-27 (×3): qty 1

## 2018-11-27 MED ORDER — FENTANYL CITRATE (PF) 100 MCG/2ML IJ SOLN: 25.0000 ug | INTRAMUSCULAR | Status: DC | PRN

## 2018-11-27 MED ORDER — DEXAMETHASONE SODIUM PHOSPHATE 10 MG/ML IJ SOLN
INTRAMUSCULAR | Status: AC
  Filled 2018-11-27: qty 1

## 2018-11-27 MED ORDER — HYDROMORPHONE HCL 1 MG/ML IJ SOLN
0.5000 mg | INTRAMUSCULAR | Status: DC | PRN
  Administered 2018-11-27: 0.5 mg via INTRAVENOUS
  Administered 2018-11-27 – 2018-11-28 (×4): 1 mg via INTRAVENOUS
  Filled 2018-11-27 (×5): qty 1

## 2018-11-27 MED ORDER — DEXAMETHASONE SODIUM PHOSPHATE 10 MG/ML IJ SOLN
10.0000 mg | Freq: Once | INTRAMUSCULAR | Status: AC
  Administered 2018-11-28: 10 mg via INTRAVENOUS
  Filled 2018-11-27: qty 1

## 2018-11-27 MED ORDER — LACTATED RINGERS IV SOLN
INTRAVENOUS | Status: DC
  Administered 2018-11-27 (×2): via INTRAVENOUS

## 2018-11-27 MED ORDER — GABAPENTIN 300 MG PO CAPS
300.0000 mg | ORAL_CAPSULE | Freq: Three times a day (TID) | ORAL | Status: DC
  Administered 2018-11-27 – 2018-11-29 (×6): 300 mg via ORAL
  Filled 2018-11-27 (×5): qty 1

## 2018-11-27 MED ORDER — ONDANSETRON HCL 4 MG PO TABS: 4.0000 mg | ORAL_TABLET | Freq: Four times a day (QID) | ORAL | Status: DC | PRN

## 2018-11-27 MED ORDER — LIDOCAINE 2% (20 MG/ML) 5 ML SYRINGE
INTRAMUSCULAR | Status: AC
  Filled 2018-11-27: qty 5

## 2018-11-27 MED ORDER — METHOCARBAMOL 500 MG PO TABS: 500.0000 mg | ORAL_TABLET | Freq: Two times a day (BID) | ORAL | 0 refills | Status: DC

## 2018-11-27 MED ORDER — PHENOL 1.4 % MT LIQD
1.0000 | OROMUCOSAL | Status: DC | PRN
  Filled 2018-11-27: qty 177

## 2018-11-27 MED ORDER — BISACODYL 5 MG PO TBEC: 5.0000 mg | DELAYED_RELEASE_TABLET | Freq: Every day | ORAL | Status: DC | PRN

## 2018-11-27 MED ORDER — BUPIVACAINE LIPOSOME 1.3 % IJ SUSP
20.0000 mL | Freq: Once | INTRAMUSCULAR | Status: DC
  Filled 2018-11-27: qty 20

## 2018-11-27 MED ORDER — POLYETHYLENE GLYCOL 3350 17 G PO PACK: 17.0000 g | PACK | Freq: Every day | ORAL | Status: DC | PRN

## 2018-11-27 MED ORDER — CHLORHEXIDINE GLUCONATE 4 % EX LIQD: 60.0000 mL | Freq: Once | CUTANEOUS | Status: DC

## 2018-11-27 MED ORDER — PROMETHAZINE HCL 25 MG/ML IJ SOLN: 6.2500 mg | INTRAMUSCULAR | Status: DC | PRN

## 2018-11-27 MED ORDER — TRANEXAMIC ACID-NACL 1000-0.7 MG/100ML-% IV SOLN: 1000.0000 mg | INTRAVENOUS | Status: DC

## 2018-11-27 MED ORDER — FENTANYL CITRATE (PF) 100 MCG/2ML IJ SOLN
INTRAMUSCULAR | Status: AC
  Filled 2018-11-27: qty 2

## 2018-11-27 MED ORDER — BUPIVACAINE-EPINEPHRINE (PF) 0.25% -1:200000 IJ SOLN
INTRAMUSCULAR | Status: AC
  Filled 2018-11-27: qty 30

## 2018-11-27 MED ORDER — DOCUSATE SODIUM 100 MG PO CAPS
100.0000 mg | ORAL_CAPSULE | Freq: Two times a day (BID) | ORAL | Status: DC
  Administered 2018-11-27 – 2018-11-29 (×5): 100 mg via ORAL
  Filled 2018-11-27 (×5): qty 1

## 2018-11-27 MED ORDER — METOCLOPRAMIDE HCL 5 MG/ML IJ SOLN: 5.0000 mg | Freq: Three times a day (TID) | INTRAMUSCULAR | Status: DC | PRN

## 2018-11-27 MED ORDER — ALUM & MAG HYDROXIDE-SIMETH 200-200-20 MG/5ML PO SUSP: 30.0000 mL | ORAL | Status: DC | PRN

## 2018-11-27 MED ORDER — ONDANSETRON HCL 4 MG/2ML IJ SOLN
INTRAMUSCULAR | Status: AC
  Filled 2018-11-27: qty 2

## 2018-11-27 MED ORDER — PROPOFOL 10 MG/ML IV BOLUS
INTRAVENOUS | Status: AC
  Filled 2018-11-27: qty 20

## 2018-11-27 MED ORDER — TRANEXAMIC ACID 1000 MG/10ML IV SOLN
INTRAVENOUS | Status: DC | PRN
  Administered 2018-11-27: 08:00:00 2000 mg via TOPICAL

## 2018-11-27 MED ORDER — BUPIVACAINE-EPINEPHRINE (PF) 0.25% -1:200000 IJ SOLN
INTRAMUSCULAR | Status: DC | PRN
  Administered 2018-11-27: 5 mL
  Administered 2018-11-27: 20 mL
  Administered 2018-11-27: 5 mL

## 2018-11-27 MED ORDER — ONDANSETRON HCL 4 MG/2ML IJ SOLN: 4.0000 mg | Freq: Four times a day (QID) | INTRAMUSCULAR | Status: DC | PRN

## 2018-11-27 MED ORDER — PHENYLEPHRINE 40 MCG/ML (10ML) SYRINGE FOR IV PUSH (FOR BLOOD PRESSURE SUPPORT)
PREFILLED_SYRINGE | INTRAVENOUS | Status: DC | PRN
  Administered 2018-11-27 (×8): 80 ug via INTRAVENOUS

## 2018-11-27 MED ORDER — POVIDONE-IODINE 10 % EX SWAB
2.0000 "application " | Freq: Once | CUTANEOUS | Status: AC
  Administered 2018-11-27: 2 via TOPICAL

## 2018-11-27 MED ORDER — SODIUM CHLORIDE (PF) 0.9 % IJ SOLN
INTRAMUSCULAR | Status: AC
  Filled 2018-11-27: qty 50

## 2018-11-27 MED ORDER — METHOCARBAMOL 500 MG PO TABS
500.0000 mg | ORAL_TABLET | Freq: Four times a day (QID) | ORAL | Status: DC | PRN
  Administered 2018-11-27 – 2018-11-29 (×5): 500 mg via ORAL
  Filled 2018-11-27 (×6): qty 1

## 2018-11-27 MED ORDER — BUPIVACAINE-EPINEPHRINE (PF) 0.25% -1:200000 IJ SOLN
INTRAMUSCULAR | Status: DC | PRN
  Administered 2018-11-27: 20 mL via PERINEURAL

## 2018-11-27 MED ORDER — SULFAMETHOXAZOLE-TRIMETHOPRIM 800-160 MG PO TABS
1.0000 | ORAL_TABLET | Freq: Two times a day (BID) | ORAL | Status: DC
  Administered 2018-11-27 – 2018-11-29 (×5): 1 via ORAL
  Filled 2018-11-27 (×5): qty 1

## 2018-11-27 MED ORDER — SODIUM CHLORIDE 0.9 % IR SOLN
Status: DC | PRN
  Administered 2018-11-27 (×2): 1000 mL

## 2018-11-27 MED ORDER — PHENYLEPHRINE 40 MCG/ML (10ML) SYRINGE FOR IV PUSH (FOR BLOOD PRESSURE SUPPORT)
PREFILLED_SYRINGE | INTRAVENOUS | Status: AC
  Filled 2018-11-27: qty 10

## 2018-11-27 MED ORDER — METHOCARBAMOL 1000 MG/10ML IJ SOLN
500.0000 mg | Freq: Four times a day (QID) | INTRAVENOUS | Status: DC | PRN
  Administered 2018-11-29: 500 mg via INTRAVENOUS
  Filled 2018-11-27: qty 5
  Filled 2018-11-27: qty 500

## 2018-11-27 MED ORDER — TRANEXAMIC ACID-NACL 1000-0.7 MG/100ML-% IV SOLN: 2000.0000 mg | Freq: Once | INTRAVENOUS | Status: DC

## 2018-11-27 MED ORDER — SODIUM CHLORIDE (PF) 0.9 % IJ SOLN
INTRAMUSCULAR | Status: DC | PRN
  Administered 2018-11-27 (×2): 20 mL
  Administered 2018-11-27: 30 mL

## 2018-11-27 MED ORDER — TRANEXAMIC ACID-NACL 1000-0.7 MG/100ML-% IV SOLN
1000.0000 mg | Freq: Once | INTRAVENOUS | Status: AC
  Administered 2018-11-27: 1000 mg via INTRAVENOUS
  Filled 2018-11-27: qty 100

## 2018-11-27 SURGICAL SUPPLY — 56 items
ATTUNE MED DOME PAT 41 KNEE (Knees) ×2 IMPLANT
ATTUNE PS FEM LT SZ 8 CEM KNEE (Femur) ×2 IMPLANT
ATTUNE PSRP INSR SZ8 5 KNEE (Insert) ×2 IMPLANT
BAG DECANTER FOR FLEXI CONT (MISCELLANEOUS) ×4 IMPLANT
BAG ZIPLOCK 12X15 (MISCELLANEOUS) ×2 IMPLANT
BANDAGE ACE 6X5 VEL STRL LF (GAUZE/BANDAGES/DRESSINGS) ×2 IMPLANT
BASE TIBIAL ROT PLAT SZ 8 KNEE (Knees) ×1 IMPLANT
BLADE SAG 18X100X1.27 (BLADE) ×2 IMPLANT
BLADE SAW SGTL 11.0X1.19X90.0M (BLADE) ×2 IMPLANT
BLADE SURG SZ10 CARB STEEL (BLADE) ×4 IMPLANT
BNDG ELASTIC 6X10 VLCR STRL LF (GAUZE/BANDAGES/DRESSINGS) ×2 IMPLANT
BOWL SMART MIX CTS (DISPOSABLE) ×2 IMPLANT
CEMENT HV SMART SET (Cement) ×4 IMPLANT
CHLORAPREP W/TINT 26 (MISCELLANEOUS) ×2 IMPLANT
COVER SURGICAL LIGHT HANDLE (MISCELLANEOUS) ×2 IMPLANT
COVER WAND RF STERILE (DRAPES) IMPLANT
CUFF TOURN SGL QUICK 34 (TOURNIQUET CUFF) ×1
CUFF TRNQT CYL 34X4.125X (TOURNIQUET CUFF) ×1 IMPLANT
DECANTER SPIKE VIAL GLASS SM (MISCELLANEOUS) ×6 IMPLANT
DRAPE U-SHAPE 47X51 STRL (DRAPES) ×2 IMPLANT
DRESSING AQUACEL AG SP 3.5X10 (GAUZE/BANDAGES/DRESSINGS) ×1 IMPLANT
DRSG AQUACEL AG ADV 3.5X10 (GAUZE/BANDAGES/DRESSINGS) ×2 IMPLANT
DRSG AQUACEL AG SP 3.5X10 (GAUZE/BANDAGES/DRESSINGS) ×2
ELECT REM PT RETURN 15FT ADLT (MISCELLANEOUS) ×2 IMPLANT
GLOVE BIO SURGEON STRL SZ7.5 (GLOVE) ×2 IMPLANT
GLOVE BIO SURGEON STRL SZ8.5 (GLOVE) ×2 IMPLANT
GLOVE BIOGEL PI IND STRL 8 (GLOVE) ×1 IMPLANT
GLOVE BIOGEL PI IND STRL 9 (GLOVE) ×1 IMPLANT
GLOVE BIOGEL PI INDICATOR 8 (GLOVE) ×1
GLOVE BIOGEL PI INDICATOR 9 (GLOVE) ×1
GOWN STRL REUS W/TWL XL LVL3 (GOWN DISPOSABLE) ×4 IMPLANT
HANDPIECE INTERPULSE COAX TIP (DISPOSABLE) ×1
HOLDER FOLEY CATH W/STRAP (MISCELLANEOUS) ×2 IMPLANT
HOOD PEEL AWAY FLYTE STAYCOOL (MISCELLANEOUS) ×6 IMPLANT
KIT TURNOVER KIT A (KITS) IMPLANT
NEEDLE HYPO 21X1.5 SAFETY (NEEDLE) ×4 IMPLANT
NS IRRIG 1000ML POUR BTL (IV SOLUTION) ×2 IMPLANT
PACK ICE MAXI GEL EZY WRAP (MISCELLANEOUS) ×2 IMPLANT
PACK TOTAL KNEE CUSTOM (KITS) ×2 IMPLANT
PIN STEINMAN FIXATION KNEE (PIN) ×2 IMPLANT
PIN THREADED HEADED SIGMA (PIN) ×2 IMPLANT
PROTECTOR NERVE ULNAR (MISCELLANEOUS) ×2 IMPLANT
SET HNDPC FAN SPRY TIP SCT (DISPOSABLE) ×1 IMPLANT
STAPLER VISISTAT 35W (STAPLE) IMPLANT
SUT VIC AB 1 CTX 36 (SUTURE) ×1
SUT VIC AB 1 CTX36XBRD ANBCTR (SUTURE) ×1 IMPLANT
SUT VIC AB 2-0 CT1 27 (SUTURE) ×1
SUT VIC AB 2-0 CT1 TAPERPNT 27 (SUTURE) ×1 IMPLANT
SUT VIC AB 3-0 CT1 27 (SUTURE) ×2
SUT VIC AB 3-0 CT1 TAPERPNT 27 (SUTURE) ×2 IMPLANT
SYR CONTROL 10ML LL (SYRINGE) ×4 IMPLANT
TIBIAL BASE ROT PLAT SZ 8 KNEE (Knees) ×2 IMPLANT
TRAY FOLEY MTR SLVR 16FR STAT (SET/KITS/TRAYS/PACK) ×2 IMPLANT
WATER STERILE IRR 1000ML POUR (IV SOLUTION) ×4 IMPLANT
WRAP KNEE MAXI GEL POST OP (GAUZE/BANDAGES/DRESSINGS) ×2 IMPLANT
YANKAUER SUCT BULB TIP 10FT TU (MISCELLANEOUS) ×2 IMPLANT

## 2018-11-27 NOTE — Op Note (Signed)
PATIENT ID:      Jesse Ruiz  MRN:     [id number] DOB/AGE:    [birth year] / 53 y.o. y.o.       OPERATIVE REPORT    DATE OF PROCEDURE:  11/27/2018       PREOPERATIVE DIAGNOSIS:   Left Knee Oesteoarthritis      Estimated body mass index is 39.61 kg/m as calculated from the following:   Height as of this encounter: 5' 10.5" (1.791 m).   Weight as of this encounter: 127 kg.                                                        POSTOPERATIVE DIAGNOSIS:   Left Knee Oesteoarthritis                                                                      PROCEDURE:  Procedure(s): TOTAL KNEE ARTHROPLASTY Using DepuyAttune RP implants #8L Femur, #8Tibia, 5 mm Attune RP bearing, 41 Patella     SURGEON: Nestor Lewandowsky    ASSISTANT:   Tomi Likens. [employer]   (Present and scrubbed throughout the case, critical for assistance with exposure, retraction, instrumentation, and closure.)         ANESTHESIA: Spinal, 20cc Exparel, 50cc 0.25% Marcaine  EBL: 300cc cc  FLUID REPLACEMENT: 1500 cc crystaloid  Tourniquet Time: None  Drains: None  Tranexamic Acid: 1gm IV, 2gm topical  Exparel:    COMPLICATIONS:  None         INDICATIONS FOR PROCEDURE: The patient has  Left Knee Oesteoarthritis, Var deformities, XR shows bone on bone arthritis, lateral subluxation of tibia. Patient has failed all conservative measures including anti-inflammatory medicines, narcotics, attempts at exercise and weight loss, cortisone injections and viscosupplementation.  Risks and benefits of surgery have been discussed, questions answered.   DESCRIPTION OF PROCEDURE: The patient identified by armband, received  IV antibiotics, in the holding area at [facility]. Patient taken to the operating room, appropriate anesthetic monitors were attached, and Spinal anesthesia was  induced. IV Tranexamic acid was given.Tourniquet applied high to the operative thigh. Lateral post and foot positioner applied to the table, the lower extremity  was then prepped and draped in usual sterile fashion from the toes to the tourniquet. Time-out procedure was performed. The skin and subcutaneous tissue along the incision was injected with 20 cc of a mixture of Exparel and Marcaine solution, using a 20-gauge by 1-1/2 inch needle. We began the operation, with the knee flexed 130 degrees, by making the anterior midline incision starting at handbreadth above the patella going over the patella 1 cm medial to and 4 cm distal to the tibial tubercle. Small bleeders in the skin and the subcutaneous tissue identified and cauterized. Transverse retinaculum was incised and reflected medially and a medial parapatellar arthrotomy was accomplished. the patella was everted and theprepatellar fat pad resected. The superficial medial collateral ligament was then elevated from anterior to posterior along the proximal flare of the tibia and anterior half of the menisci resected. The knee was hyperflexed exposing bone  on bone arthritis. Peripheral and notch osteophytes as well as the cruciate ligaments were then resected. We continued to work our way around posteriorly along the proximal tibia, and externally rotated the tibia subluxing it out from underneath the femur. A McHale retractor was placed through the notch and a lateral Hohmann retractor placed, and we then drilled through the proximal tibia in line with the axis of the tibia followed by an intramedullary guide rod and 2-degree posterior slope cutting guide. The tibial cutting guide, 4 degree posterior sloped, was pinned into place allowing resection of 4 mm of bone medially and 10 mm of bone laterally. Satisfied with the tibial resection, we then entered the distal femur 2 mm anterior to the PCL origin with the intramedullary guide rod and applied the distal femoral cutting guide set at 9 mm, with 5 degrees of valgus. This was pinned along the epicondylar axis. At this point, the distal femoral cut was accomplished without  difficulty. We then sized for a #8L femoral component and pinned the guide in 3 degrees of external rotation. The chamfer cutting guide was pinned into place. The anterior, posterior, and chamfer cuts were accomplished without difficulty followed by the Attune RP box cutting guide and the box cut. We also removed posterior osteophytes from the posterior femoral condyles. The posterior capsule was injected with Exparel solution. The knee was brought into full extension. We checked our extension gap and fit a 5 mm bearing. Distracting in extension with a lamina spreader,  bleeders in the posterior capsule, Posterior medial and posterior lateral right down and cauterized.  The transexamic acid-soaked sponge was then placed in the gap of the knee and extension. The knee was flexed 30. The posterior patella cut was accomplished with the 9.5 mm Attune cutting guide, sized for a 41mm dome, and the fixation pegs drilled.The knee was then once again hyperflexed exposing the proximal tibia. We sized for a # 8 tibial base plate, applied the smokestack and the conical reamer followed by the the Delta fin keel punch. We then hammered into place the Attune RP trial femoral component, drilled the lugs, inserted a  5 mm trial bearing, trial patellar button, and took the knee through range of motion from 0-130 degrees. Medial and lateral ligamentous stability was checked. No thumb pressure was required for patellar Tracking. The tourniquet was not used. All trial components were removed, mating surfaces irrigated with pulse lavage, and dried with suction and sponges. 10 cc of the Exparel solution was applied to the cancellus bone of the patella distal femur and proximal tibia.  After waiting 30 seconds, the bony surfaces were again, dried with sponges. A double batch of DePuy HV cement was mixed and applied to all bony metallic mating surfaces except for the posterior condyles of the femur itself. In order, we hammered into place  the tibial tray and removed excess cement, the femoral component and removed excess cement. The final Attune RP bearing was inserted, and the knee brought to full extension with compression. The patellar button was clamped into place, and excess cement removed. The knee was held at 30 flexion with compression, while the cement cured. The wound was irrigated out with normal saline solution pulse lavage. The rest of the Exparel was injected into the parapatellar arthrotomy, subcutaneous tissues, and periosteal tissues. The parapatellar arthrotomy was closed with running #1 Vicryl suture. The subcutaneous tissue with 0 and 2-0 undyed Vicryl suture, and the skin with running 3-0 SQ vicryl. An Aquacil and  Ace wrap were applied. The patient was taken to recovery room without difficulty.   Nestor Lewandowsky 11/27/2018, 9:12 AM

## 2018-11-27 NOTE — Anesthesia Procedure Notes (Signed)
Anesthesia Regional Block: Adductor canal block   Pre-Anesthetic Checklist: ,, timeout performed, Correct Patient, Correct Site, Correct Laterality, Correct Procedure, Correct Position, site marked, Risks and benefits discussed,  Surgical consent,  Pre-op evaluation,  At surgeon's request and post-op pain management  Laterality: Left  Prep: chloraprep       Needles:  Injection technique: Single-shot  Needle Type: Echogenic Needle     Needle Length: 9cm  Needle Gauge: 21     Additional Needles:   Procedures:,,,, ultrasound used (permanent image in chart),,,,  Narrative:  Start time: 11/27/2018 7:03 AM End time: 11/27/2018 7:10 AM Injection made incrementally with aspirations every 5 mL.  Performed by: Personally  Anesthesiologist: Marcene Duos, MD

## 2018-11-27 NOTE — Progress Notes (Signed)
Pt arrives to [facility] for preop with audible wheezing and raspy voice but denies signs of infection and states he did smoke today.  Denies shortness of breath.

## 2018-11-27 NOTE — Discharge Instructions (Signed)

## 2018-11-27 NOTE — Interval H&P Note (Signed)
History and Physical Interval Note:  11/27/2018 7:17 AM  Jesse Ruiz  has presented today for surgery, with the diagnosis of Left Knee Oesteoarthritis.  The various methods of treatment have been discussed with the patient and family. After consideration of risks, benefits and other options for treatment, the patient has consented to  Procedure(s): TOTAL KNEE ARTHROPLASTY (Left) as a surgical intervention.  The patient's history has been reviewed, patient examined, no change in status, stable for surgery.  I have reviewed the patient's chart and labs.  Questions were answered to the patient's satisfaction.     Nestor Lewandowsky

## 2018-11-27 NOTE — Anesthesia Procedure Notes (Deleted)
Spinal  Patient location during procedure: OR End time: 11/27/2018 7:35 AM Staffing Anesthesiologist: Suzette Battiest, MD Performed: anesthesiologist  Preanesthetic Checklist Completed: patient identified, site marked, surgical consent, pre-op evaluation, timeout performed, IV checked, risks and benefits discussed and monitors and equipment checked Spinal Block Patient position: sitting Prep: DuraPrep Patient monitoring: continuous pulse ox, blood pressure, heart rate and cardiac monitor Approach: midline Location: L3-4 Injection technique: single-shot Needle Needle type: Quincke  Needle gauge: 22 G Needle length: 9 cm Assessment Sensory level: T6 Additional Notes Expiration date of kit checked and confirmed. Patient tolerated procedure well, without complications.

## 2018-11-27 NOTE — Anesthesia Procedure Notes (Addendum)
Spinal  Patient location during procedure: OR Start time: 11/27/2018 7:28 AM End time: 11/27/2018 7:35 AM Staffing Anesthesiologist: Marcene Duos, MD Performed: anesthesiologist  Preanesthetic Checklist Completed: patient identified, site marked, surgical consent, pre-op evaluation, timeout performed, IV checked, risks and benefits discussed and monitors and equipment checked Spinal Block Patient position: sitting Prep: DuraPrep Patient monitoring: heart rate, cardiac monitor, continuous pulse ox and blood pressure Approach: right paramedian Location: L3-4 Injection technique: single-shot Needle Needle type: Whitacre  Needle gauge: 22 G Needle length: 9 cm Assessment Sensory level: T4 Additional Notes Unable to get CSF via midline approach. Successful paramedian spinal.

## 2018-11-27 NOTE — Evaluation (Signed)
Physical Therapy Evaluation Patient Details Name: Jesse Ruiz MRN: [id number] DOB: 06/08/1966 Today's Date: 11/27/2018   History of Present Illness  s/p L TKA  Clinical Impression  Pt is s/p TKA resulting in the deficits listed below (see PT Problem List).  Pt will benefit from skilled PT to increase their independence and safety with mobility to allow discharge to the venue listed below.      Follow Up Recommendations Follow surgeon's recommendation for DC plan and follow-up therapies    Equipment Recommendations  None recommended by PT    Recommendations for Other Services       Precautions / Restrictions Precautions Precautions: Knee Restrictions Weight Bearing Restrictions: No Other Position/Activity Restrictions: WBAT      Mobility  Bed Mobility Overal bed mobility: Needs Assistance Bed Mobility: Supine to Sit     Supine to sit: Min guard;Min assist     General bed mobility comments: for safety, light assist with LLE  Transfers Overall transfer level: Needs assistance Equipment used: Rolling walker (2 wheeled) Transfers: Sit to/from Stand Sit to Stand: Min assist;Min guard         General transfer comment: cues for safety and hand placement  Ambulation/Gait Ambulation/Gait assistance: Min guard;Min assist Gait Distance (Feet): 60 Feet Assistive device: Rolling walker (2 wheeled) Gait Pattern/deviations: Step-to pattern;Antalgic     General Gait Details: cues for sequence and safety. pt shaky d/t pain radiating to RLE  Stairs            Wheelchair Mobility    Modified Rankin (Stroke Patients Only)       Balance                                             Pertinent Vitals/Pain Pain Assessment: 0-10 Pain Score: 5  Pain Location: L knee Pain Descriptors / Indicators: Burning Pain Intervention(s): Limited activity within patient's tolerance;Monitored during session    Home Living Family/patient expects to be  discharged to:: Private residence Living Arrangements: Spouse/significant other Available Help at Discharge: Family Type of Home: Mobile home Home Access: Stairs to enter   Secretary/administratorntrance Stairs-Number of Steps: 5 Home Layout: One level Home Equipment: Environmental consultantWalker - 2 wheels      Prior Function Level of Independence: Independent               Hand Dominance        Extremity/Trunk Assessment   Upper Extremity Assessment Upper Extremity Assessment: Overall WFL for tasks assessed    Lower Extremity Assessment Lower Extremity Assessment: LLE deficits/detail LLE Deficits / Details: ankle WFL, knee and hip limited by post op pain       Communication   Communication: No difficulties  Cognition Arousal/Alertness: Awake/alert Behavior During Therapy: WFL for tasks assessed/performed Overall Cognitive Status: Within Functional Limits for tasks assessed                                        General Comments      Exercises Total Joint Exercises Ankle Circles/Pumps: AROM;Both;10 reps Quad Sets: AROM;Both;10 reps   Assessment/Plan    PT Assessment Patient needs continued PT services  PT Problem List Decreased strength;Decreased mobility;Decreased activity tolerance;Decreased knowledge of use of DME;Decreased range of motion       PT Treatment Interventions  Gait training;DME instruction;Functional mobility training;Therapeutic activities;Patient/family education;Therapeutic exercise;Stair training    PT Goals (Current goals can be found in the Care Plan section)  Acute Rehab PT Goals PT Goal Formulation: With patient Time For Goal Achievement: 12/11/18 Potential to Achieve Goals: Good    Frequency 7X/week   Barriers to discharge        Co-evaluation               AM-PAC PT "6 Clicks" Mobility  Outcome Measure Help needed turning from your back to your side while in a flat bed without using bedrails?: A Little Help needed moving from lying on  your back to sitting on the side of a flat bed without using bedrails?: A Little Help needed moving to and from a bed to a chair (including a wheelchair)?: A Little Help needed standing up from a chair using your arms (e.g., wheelchair or bedside chair)?: A Little Help needed to walk in hospital room?: A Little Help needed climbing 3-5 steps with a railing? : A Lot 6 Click Score: 17    End of Session Equipment Utilized During Treatment: Gait belt Activity Tolerance: Patient tolerated treatment well Patient left: in chair;with call bell/phone within reach;with chair alarm set   PT Visit Diagnosis: Difficulty in walking, not elsewhere classified (R26.2)    Time: 3557-3220 PT Time Calculation (min) (ACUTE ONLY): 28 min   Charges:   PT Evaluation $PT Eval Low Complexity: 1 Low PT Treatments $Gait Training: 8-22 mins        Drucilla Chalet, PT  Pager: [phone] Acute Rehab Dept [facility]): [phone]   11/27/2018   [facility] 11/27/2018, 5:50 PM

## 2018-11-27 NOTE — Transfer of Care (Signed)
Immediate Anesthesia Transfer of Care Note  Patient: Jesse Ruiz  Procedure(s) Performed: TOTAL KNEE ARTHROPLASTY (Left )  Patient Location: PACU  Anesthesia Type:Spinal  Level of Consciousness: awake, alert , oriented and patient cooperative  Airway & Oxygen Therapy: Patient Spontanous Breathing and Patient connected to face mask oxygen  Post-op Assessment: Report given to RN, Post -op Vital signs reviewed and stable and Patient moving all extremities  Post vital signs: Reviewed and stable  Last Vitals:  Vitals Value Taken Time  BP 183/164 11/27/2018  9:31 AM  Temp    Pulse 84 11/27/2018  9:33 AM  Resp 19 11/27/2018  9:33 AM  SpO2 97 % 11/27/2018  9:33 AM  Vitals shown include unvalidated device data.  Last Pain:  Vitals:   11/27/18 0541  TempSrc: Oral  PainSc: 0-No pain      Patients Stated Pain Goal: 5 (11/27/18 0541)  Complications: No apparent anesthesia complications

## 2018-11-27 NOTE — Anesthesia Procedure Notes (Deleted)
Spinal  Patient location during procedure: OR End time: 11/27/2018 7:35 AM Staffing Anesthesiologist: Shianne Zeiser, MD Performed: anesthesiologist  Preanesthetic Checklist Completed: patient identified, site marked, surgical consent, pre-op evaluation, timeout performed, IV checked, risks and benefits discussed and monitors and equipment checked Spinal Block Patient position: sitting Prep: DuraPrep Patient monitoring: continuous pulse ox, blood pressure, heart rate and cardiac monitor Approach: midline Location: L3-4 Injection technique: single-shot Needle Needle type: Quincke  Needle gauge: 22 G Needle length: 9 cm Assessment Sensory level: T6 Additional Notes Expiration date of kit checked and confirmed. Patient tolerated procedure well, without complications.     

## 2018-11-27 NOTE — Plan of Care (Signed)
Plan of care 

## 2018-11-28 ENCOUNTER — Encounter (HOSPITAL_COMMUNITY): Payer: Self-pay | Admitting: Orthopedic Surgery

## 2018-11-28 DIAGNOSIS — M1712 Unilateral primary osteoarthritis, left knee: Secondary | ICD-10-CM | POA: Diagnosis not present

## 2018-11-28 LAB — BASIC METABOLIC PANEL
Anion gap: 7 (ref 5–15)
BUN: 15 mg/dL (ref 6–20)
CO2: 21 mmol/L — ABNORMAL LOW (ref 22–32)
Calcium: 8.9 mg/dL (ref 8.9–10.3)
Chloride: 101 mmol/L (ref 98–111)
Creatinine, Ser: 0.87 mg/dL (ref 0.61–1.24)
GFR calc Af Amer: >60 mL/min (ref >60)
GFR calc non Af Amer: >60 mL/min (ref >60)
Glucose, Bld: 221 mg/dL — ABNORMAL HIGH (ref 70–99)
Potassium: 4.7 mmol/L (ref 3.5–5.1)
Sodium: 129 mmol/L — ABNORMAL LOW (ref 135–145)

## 2018-11-28 LAB — CBC
HCT: 42.8 % (ref 39.0–52.0)
Hemoglobin: 14.4 g/dL (ref 13.0–17.0)
MCH: 31.7 pg (ref 26.0–34.0)
MCHC: 33.6 g/dL (ref 30.0–36.0)
MCV: 94.3 fL (ref 80.0–100.0)
Platelets: 177 10*3/uL (ref 150–400)
RBC: 4.54 MIL/uL (ref 4.22–5.81)
RDW: 11.6 % (ref 11.5–15.5)
WBC: 16.7 10*3/uL — ABNORMAL HIGH (ref 4.0–10.5)
nRBC: 0 % (ref 0.0–0.2)

## 2018-11-28 MED ORDER — METHOCARBAMOL 500 MG PO TABS: 500.0000 mg | ORAL_TABLET | Freq: Two times a day (BID) | ORAL | 0 refills | Status: DC

## 2018-11-28 MED ORDER — HYDROMORPHONE HCL 2 MG PO TABS
2.0000 mg | ORAL_TABLET | ORAL | Status: DC | PRN
  Administered 2018-11-28 – 2018-11-29 (×5): 4 mg via ORAL
  Filled 2018-11-28 (×5): qty 2

## 2018-11-28 MED ORDER — ASPIRIN EC 81 MG PO TBEC: 81.0000 mg | DELAYED_RELEASE_TABLET | Freq: Two times a day (BID) | ORAL | 0 refills | Status: DC

## 2018-11-28 MED ORDER — HYDROMORPHONE HCL 4 MG PO TABS: 2.0000 mg | ORAL_TABLET | ORAL | 0 refills | Status: DC | PRN

## 2018-11-28 NOTE — Progress Notes (Signed)
Physical Therapy Treatment Patient Details Name: Jesse LigasSean L Sakamoto MRN: [id number] DOB: 06/05/1966 Today's Date: 11/28/2018    History of Present Illness s/p L TKA    PT Comments    Pt continues to have significant pain despite meds. Provided with multiple modalities ( ice to knee and heat to thigh) for additional pain relief. Exercises deferred d/t pain and LLE edema. Pt not ready for d/c at this time; will continue to follow.   Follow Up Recommendations  Follow surgeon's recommendation for DC plan and follow-up therapies     Equipment Recommendations  None recommended by PT    Recommendations for Other Services       Precautions / Restrictions Precautions Precautions: Knee Restrictions Weight Bearing Restrictions: No Other Position/Activity Restrictions: WBAT    Mobility  Bed Mobility Overal bed mobility: Needs Assistance Bed Mobility: Supine to Sit     Supine to sit: Min guard;Min assist     General bed mobility comments: instructed in use of gait belt as leg lifter; light guiding assist d/t pain  Transfers Overall transfer level: Needs assistance Equipment used: Rolling walker (2 wheeled) Transfers: Sit to/from Stand Sit to Stand: Min guard         General transfer comment: cues for safety and hand placement  Ambulation/Gait Ambulation/Gait assistance: Supervision;Min guard Gait Distance (Feet): 50 Feet Assistive device: Rolling walker (2 wheeled) Gait Pattern/deviations: Step-to pattern;Antalgic Gait velocity: decr   General Gait Details: cues for sequence and safety, use of UEs to decr wt on LLE for pain control   Stairs             Wheelchair Mobility    Modified Rankin (Stroke Patients Only)       Balance                                            Cognition Arousal/Alertness: Awake/alert Behavior During Therapy: WFL for tasks assessed/performed Overall Cognitive Status: Within Functional Limits for tasks assessed                                        Exercises Total Joint Exercises Ankle Circles/Pumps: AROM;Both;10 reps Quad Sets: Limitations Quad Sets Limitations: unable to do further exercises d/t pain level     General Comments        Pertinent Vitals/Pain Pain Assessment: 0-10 Pain Score: 9  Pain Location: L knee Pain Descriptors / Indicators: Burning;Sore Pain Intervention(s): Limited activity within patient's tolerance;Monitored during session;Premedicated before session;Repositioned;Ice applied;Heat applied(heat to thigh, ice over incision)    Home Living                      Prior Function            PT Goals (current goals can now be found in the care plan section) Acute Rehab PT Goals PT Goal Formulation: With patient Time For Goal Achievement: 12/11/18 Potential to Achieve Goals: Good Progress towards PT goals: Progressing toward goals    Frequency    7X/week      PT Plan Current plan remains appropriate    Co-evaluation              AM-PAC PT "6 Clicks" Mobility   Outcome Measure  Help needed turning from your back to your  side while in a flat bed without using bedrails?: A Little Help needed moving from lying on your back to sitting on the side of a flat bed without using bedrails?: A Little Help needed moving to and from a bed to a chair (including a wheelchair)?: A Little Help needed standing up from a chair using your arms (e.g., wheelchair or bedside chair)?: A Little Help needed to walk in hospital room?: A Little Help needed climbing 3-5 steps with a railing? : A Lot 6 Click Score: 17    End of Session Equipment Utilized During Treatment: Gait belt Activity Tolerance: Patient tolerated treatment well Patient left: with call bell/phone within reach;in chair   PT Visit Diagnosis: Difficulty in walking, not elsewhere classified (R26.2)     Time: 6767-2094 PT Time Calculation (min) (ACUTE ONLY): 19 min  Charges:   $Gait Training: 8-22 mins                     Drucilla Chalet, PT  Pager: [phone] Acute Rehab Dept [facility]): [phone]   11/28/2018    [facility] 11/28/2018, 3:49 PM

## 2018-11-28 NOTE — Plan of Care (Signed)
  Problem: Clinical Measurements: Goal: Ability to maintain clinical measurements within normal limits will improve Outcome: Progressing   Problem: Clinical Measurements: Goal: Will remain free from infection Outcome: Progressing   Problem: Clinical Measurements: Goal: Respiratory complications will improve Outcome: Progressing   Problem: Clinical Measurements: Goal: Cardiovascular complication will be avoided Outcome: Progressing   Problem: Activity: Goal: Risk for activity intolerance will decrease Outcome: Progressing   

## 2018-11-28 NOTE — Progress Notes (Signed)
Physical Therapy Treatment Patient Details Name: Jesse Ruiz MRN: [id number] DOB: Mar 23, 1966 Today's Date: 11/28/2018    History of Present Illness s/p L TKA    PT Comments    Pt progressing however with incr pain this am and unable to attempt stairs; will see again for second session; possible d/c if does well and pain controlled  Follow Up Recommendations  Follow surgeon's recommendation for DC plan and follow-up therapies     Equipment Recommendations  None recommended by PT    Recommendations for Other Services       Precautions / Restrictions Precautions Precautions: Knee Restrictions Weight Bearing Restrictions: No Other Position/Activity Restrictions: WBAT    Mobility  Bed Mobility Overal bed mobility: Needs Assistance Bed Mobility: Supine to Sit     Supine to sit: Min guard;Min assist     General bed mobility comments: instructed in use of gait belt as leg lifter  Transfers Overall transfer level: Needs assistance Equipment used: Rolling walker (2 wheeled) Transfers: Sit to/from Stand Sit to Stand: Min guard         General transfer comment: cues for safety and hand placement  Ambulation/Gait Ambulation/Gait assistance: Supervision Gait Distance (Feet): 80 Feet Assistive device: Rolling walker (2 wheeled) Gait Pattern/deviations: Step-to pattern;Antalgic     General Gait Details: cues for sequence and safety, use of UEs to decr wt on LLE for pain control   Stairs             Wheelchair Mobility    Modified Rankin (Stroke Patients Only)       Balance                                            Cognition Arousal/Alertness: Awake/alert Behavior During Therapy: WFL for tasks assessed/performed Overall Cognitive Status: Within Functional Limits for tasks assessed                                        Exercises Total Joint Exercises Ankle Circles/Pumps: AROM;Both;10 reps    General Comments         Pertinent Vitals/Pain Pain Assessment: 0-10 Pain Score: 8  Pain Location: L knee Pain Descriptors / Indicators: Burning;Sore Pain Intervention(s): Limited activity within patient's tolerance;Monitored during session;Premedicated before session;Repositioned    Home Living                      Prior Function            PT Goals (current goals can now be found in the care plan section) Acute Rehab PT Goals PT Goal Formulation: With patient Time For Goal Achievement: 12/11/18 Potential to Achieve Goals: Good Progress towards PT goals: Progressing toward goals    Frequency    7X/week      PT Plan Current plan remains appropriate    Co-evaluation              AM-PAC PT "6 Clicks" Mobility   Outcome Measure  Help needed turning from your back to your side while in a flat bed without using bedrails?: A Little Help needed moving from lying on your back to sitting on the side of a flat bed without using bedrails?: A Little Help needed moving to and from a bed to a chair (including  a wheelchair)?: A Little Help needed standing up from a chair using your arms (e.g., wheelchair or bedside chair)?: A Little Help needed to walk in hospital room?: A Little Help needed climbing 3-5 steps with a railing? : A Lot 6 Click Score: 17    End of Session Equipment Utilized During Treatment: Gait belt Activity Tolerance: Patient tolerated treatment well Patient left: in bed;with call bell/phone within reach   PT Visit Diagnosis: Difficulty in walking, not elsewhere classified (R26.2)     Time: [id number] PT Time Calculation (min) (ACUTE ONLY): 17 min  Charges:  $Gait Training: 8-22 mins                     Drucilla Chaletara Shyhiem Beeney, PT  Pager: [phone] Acute Rehab Dept [facility]): [phone]   11/28/2018    [facility] MountainWILLIAMS,Jesse Ruiz 11/28/2018, 9:49 AM

## 2018-11-28 NOTE — TOC Progression Note (Addendum)
Transition of Care [facility]) - Progression Note    Patient Details  Name: Jesse Ruiz MRN: [id number] Date of Birth: 1965-08-03  Transition of Care [facility]) CM/SW Contact  Golda Acre, RN Phone Number: 11/28/2018, 10:08 AM  Clinical Narrative:    tct-[place]/name of hhc agency obtain and information called to agency for referral and orders faxed. [phone] information requested faxed to [facility] for services.  Expected Discharge Plan: Home w Home Health Services Barriers to Discharge: No Barriers Identified  Expected Discharge Plan and Services Expected Discharge Plan: Home w Home Health Services   Discharge Planning Services: CM Consult Post Acute Care Choice: Durable Medical Equipment, Home Health Living arrangements for the past 2 months: Single Family Home                 DME Arranged: 3-N-1, Walker rolling DME Agency: [employer] Date DME Agency Contacted: 11/28/18 Time DME Agency Contacted: 0900 Representative spoke with at DME Agency: Harrold Donath HH Arranged: PT HH Agency: Other - See comment([facility]) Date HH Agency Contacted: 11/28/18 Time HH Agency Contacted: 1000 Representative spoke with at [facility] Agency: Gemma Payor   Social Determinants of Health (SDOH) Interventions    Readmission Risk Interventions No flowsheet data found.

## 2018-11-28 NOTE — Anesthesia Postprocedure Evaluation (Signed)
Anesthesia Post Note  Patient: Jesse Ruiz  Procedure(s) Performed: TOTAL KNEE ARTHROPLASTY (Left )     Patient location during evaluation: PACU Anesthesia Type: MAC and Spinal Level of consciousness: awake and alert Pain management: pain level controlled Vital Signs Assessment: post-procedure vital signs reviewed and stable Respiratory status: spontaneous breathing and respiratory function stable Cardiovascular status: blood pressure returned to baseline and stable Postop Assessment: spinal receding Anesthetic complications: no    Last Vitals:  Vitals:   11/28/18 0522 11/28/18 1413  BP: (!) 160/89 (!) 142/93  Pulse: 79 92  Resp: 16   Temp: 36.6 C 36.8 C  SpO2: 98% 97%    Last Pain:  Vitals:   11/28/18 1829  TempSrc:   PainSc: 8                  Tiajuana Amass

## 2018-11-28 NOTE — Progress Notes (Signed)
PATIENT ID: Jesse Ruiz  MRN: [id number]  DOB/AGE:  [birth year] / 53 y.o.  1 Day Post-Op Procedure(s) (LRB): TOTAL KNEE ARTHROPLASTY (Left)    PROGRESS NOTE Subjective: Patient is alert, oriented, no Nausea, no Vomiting, yes passing gas. Taking PO well. Denies SOB, Chest or Calf Pain. Using Incentive Spirometer, PAS in place. Ambulate 60', Patient reports pain as 8/10 .    Objective: Vital signs in last 24 hours: Vitals:   11/27/18 1823 11/27/18 2044 11/28/18 0148 11/28/18 0522  BP: (Abnormal) 171/98 (Abnormal) 155/89 130/84 (Abnormal) 160/89  Pulse: (Abnormal) 101 96 68 79  Resp: 18 16 16 16   Temp: 98.1 F (36.7 C) 98.2 F (36.8 C) 97.9 F (36.6 C) 97.8 F (36.6 C)  TempSrc:  Oral Oral Oral  SpO2: 95% 97% 95% 98%  Weight:      Height:          Intake/Output from previous day: I/O last 3 completed shifts: In: 4585.7 [P.O.:960; I.V.:3375.7; IV Piggyback:250] Out: 6800 [Urine:6750; Blood:50]   Intake/Output this shift: No intake/output data recorded.   LABORATORY DATA: Recent Labs    11/28/18 0341  WBC 16.7*  HGB 14.4  HCT 42.8  PLT 177  NA 129*  K 4.7  CL 101  CO2 21*  BUN 15  CREATININE 0.87  GLUCOSE 221*  CALCIUM 8.9    Examination: Neurologically intact ABD soft Neurovascular intact Sensation intact distally Intact pulses distally Dorsiflexion/Plantar flexion intact Incision: scant drainage No cellulitis present Compartment soft}  Assessment:   1 Day Post-Op Procedure(s) (LRB): TOTAL KNEE ARTHROPLASTY (Left) ADDITIONAL DIAGNOSIS: Expected Acute Blood Loss Anemia,   Patient's anticipated LOS is less than 2 midnights, meeting these requirements: - Younger than 5 - Lives within 1 hour of care - Has a competent adult at home to recover with post-op recover - NO history of  - Chronic pain requiring opiods  - Diabetes  - Coronary Artery Disease  - Heart failure  - Heart attack  - Stroke  - DVT/VTE  - Cardiac arrhythmia  - Respiratory  Failure/COPD  - Renal failure  - Anemia  - Advanced Liver disease       Plan: PT/OT WBAT, AROM and PROM  Increase Dilaudid to 2 to 4 mg p.o. every 4 hours as needed DVT Prophylaxis:  SCDx72hrs, ASA 81 mg BID x 2 weeks DISCHARGE PLAN: Home if patient is able to pass physical therapy today DISCHARGE NEEDS: HHPT, Walker and 3-in-1 comode seat     Nestor Lewandowsky 11/28/2018, 7:33 AM Patient ID: Jesse Ruiz, male   DOB: 11-03-1965, 53 y.o.   MRN: [id number]

## 2018-11-29 DIAGNOSIS — M1712 Unilateral primary osteoarthritis, left knee: Secondary | ICD-10-CM | POA: Diagnosis not present

## 2018-11-29 LAB — CBC
HCT: 40.6 % (ref 39.0–52.0)
Hemoglobin: 13.4 g/dL (ref 13.0–17.0)
MCH: 31.9 pg (ref 26.0–34.0)
MCHC: 33 g/dL (ref 30.0–36.0)
MCV: 96.7 fL (ref 80.0–100.0)
Platelets: 152 10*3/uL (ref 150–400)
RBC: 4.2 MIL/uL — ABNORMAL LOW (ref 4.22–5.81)
RDW: 12 % (ref 11.5–15.5)
WBC: 12.6 10*3/uL — ABNORMAL HIGH (ref 4.0–10.5)
nRBC: 0 % (ref 0.0–0.2)

## 2018-11-29 NOTE — Progress Notes (Signed)
At 1120, the pt was provided with d/c instructions. After discussing the pt's plan of care upon d/c home, the pt reported no further questions or concerns.
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
## 2018-11-29 NOTE — Progress Notes (Signed)
PATIENT ID: Jesse Ruiz  MRN: [id number]  DOB/AGE:  03/21/1966 / 53 y.o.  2 Days Post-Op Procedure(s) (LRB): TOTAL KNEE ARTHROPLASTY (Left)    PROGRESS NOTE Subjective: Patient is alert, oriented, no Nausea, no Vomiting, yes passing gas. Taking PO well. Denies SOB, Chest or Calf Pain. Using Incentive Spirometer, PAS in place. Ambulate WBAT with pt walking 50 ft with therapy, Patient reports pain as 5/10 .    Objective: Vital signs in last 24 hours: Vitals:   11/28/18 1413 11/28/18 2237 11/29/18 0055 11/29/18 0705  BP: (!) 142/93 (!) 161/100 (!) 156/91 (!) 142/110  Pulse: 92 93 93 93  Resp:  18 16 18   Temp: 98.3 F (36.8 C) 98.2 F (36.8 C) 98 F (36.7 C) 98 F (36.7 C)  TempSrc: Oral  Oral Oral  SpO2: 97% 99% 97% 97%  Weight:      Height:          Intake/Output from previous day: I/O last 3 completed shifts: In: 2956.7 [P.O.:900; I.V.:2006.7; IV Piggyback:50] Out: 7200 [Urine:7200]   Intake/Output this shift: No intake/output data recorded.   LABORATORY DATA: Recent Labs    11/28/18 0341 11/29/18 0254  WBC 16.7* 12.6*  HGB 14.4 13.4  HCT 42.8 40.6  PLT 177 152  NA 129*  --   K 4.7  --   CL 101  --   CO2 21*  --   BUN 15  --   CREATININE 0.87  --   GLUCOSE 221*  --   CALCIUM 8.9  --     Examination: Neurologically intact Neurovascular intact Sensation intact distally Intact pulses distally Dorsiflexion/Plantar flexion intact Incision: dressing C/D/I and scant drainage No cellulitis present Compartment soft}  Assessment:   2 Days Post-Op Procedure(s) (LRB): TOTAL KNEE ARTHROPLASTY (Left) ADDITIONAL DIAGNOSIS: Expected Acute Blood Loss Anemia,  Anticipated LOS equal to or greater than 2 midnights due to - Age 58 and older with one or more of the following:  - Obesity  - Expected need for hospital services (PT, OT, Nursing) required for safe  discharge  - Anticipated need for postoperative skilled nursing care or inpatient rehab  - Active  co-morbidities: Chronic pain requiring opiods     Plan: PT/OT WBAT, AROM and PROM  DVT Prophylaxis:  SCDx72hrs, ASA 81 mg BID x 2 weeks DISCHARGE PLAN: Home, today once he passes therapy goals DISCHARGE NEEDS: HHPT, Walker and 3-in-1 comode seat     Dannielle Burn 11/29/2018, 7:24 AM

## 2018-11-29 NOTE — Progress Notes (Signed)
Physical Therapy Treatment Patient Details Name: Jesse Ruiz MRN: [id number] DOB: [birth year] Today's Date: 11/29/2018    History of Present Illness s/p L TKA    PT Comments    Patient seen for mobility progression. Session focusing on exercise instruction as well as progressing ambulation and stair navigation. Patient performing all functional mobility at min guard level. Verbal cueing for safety and sequencing with RW with patient able to progress step length with progressive mobility. Patient ambulating up down 3 stpees x 3 reps with good tolerance with no knee buckle noted. Patient making excellent progress. From a mobility standpoint ready for d/c when medically appropriate. Will continue to follow.     Follow Up Recommendations  Follow surgeon's recommendation for DC plan and follow-up therapies     Equipment Recommendations  Rolling walker with 5" wheels    Recommendations for Other Services       Precautions / Restrictions Precautions Precautions: Knee Precaution Comments: reviewed no pillow under knee Restrictions Weight Bearing Restrictions: No Other Position/Activity Restrictions: WBAT    Mobility  Bed Mobility Overal bed mobility: Needs Assistance Bed Mobility: Supine to Sit;Sit to Supine     Supine to sit: Min guard Sit to supine: Min guard   General bed mobility comments: guiding assist to EOB; with return to bed educated on hook technique to self assist  Transfers Overall transfer level: Needs assistance Equipment used: Rolling walker (2 wheeled) Transfers: Sit to/from Stand Sit to Stand: Min guard         General transfer comment: min guard for safety and initial standing balance  Ambulation/Gait Ambulation/Gait assistance: Min guard;Supervision Gait Distance (Feet): 150 Feet(x2) Assistive device: Rolling walker (2 wheeled) Gait Pattern/deviations: Step-to pattern;Decreased stride length;Decreased weight shift to left;Antalgic Gait velocity:  decreased   General Gait Details: initially with very short steps - with increased distance able to progress step length and tolerance to weight bearing   Stairs Stairs: Yes Stairs assistance: Min guard Stair Management: One rail Left;Step to pattern;Sideways Number of Stairs: 3(x3) General stair comments: educated/handout given on lateral step navigation; min guard for safety with cueing for sequencing   Wheelchair Mobility    Modified Rankin (Stroke Patients Only)       Balance Overall balance assessment: Mild deficits observed, not formally tested                                          Cognition Arousal/Alertness: Awake/alert Behavior During Therapy: WFL for tasks assessed/performed Overall Cognitive Status: Within Functional Limits for tasks assessed                                        Exercises Total Joint Exercises Ankle Circles/Pumps: AROM;Both;10 reps Quad Sets: AROM;Left;10 reps Heel Slides: AAROM;Left;10 reps;Supine    General Comments General comments (skin integrity, edema, etc.): good motivation      Pertinent Vitals/Pain Pain Assessment: 0-10 Pain Score: 7  Pain Location: L knee Pain Descriptors / Indicators: Aching;Discomfort;Guarding Pain Intervention(s): Limited activity within patient's tolerance;Monitored during session;Repositioned;Ice applied;RN gave pain meds during session    Home Living                      Prior Function            PT Goals (  current goals can now be found in the care plan section) Acute Rehab PT Goals PT Goal Formulation: With patient Time For Goal Achievement: 12/11/18 Potential to Achieve Goals: Good Progress towards PT goals: Progressing toward goals    Frequency    7X/week      PT Plan Current plan remains appropriate    Co-evaluation              AM-PAC PT "6 Clicks" Mobility   Outcome Measure  Help needed turning from your back to your side  while in a flat bed without using bedrails?: A Little Help needed moving from lying on your back to sitting on the side of a flat bed without using bedrails?: A Little Help needed moving to and from a bed to a chair (including a wheelchair)?: A Little Help needed standing up from a chair using your arms (e.g., wheelchair or bedside chair)?: A Little Help needed to walk in hospital room?: A Little Help needed climbing 3-5 steps with a railing? : A Little 6 Click Score: 18    End of Session Equipment Utilized During Treatment: Gait belt Activity Tolerance: Patient tolerated treatment well Patient left: in bed;with call bell/phone within reach Nurse Communication: Mobility status PT Visit Diagnosis: Difficulty in walking, not elsewhere classified (R26.2)     Time: [id number] PT Time Calculation (min) (ACUTE ONLY): 48 min  Charges:  $Gait Training: 23-37 mins $Therapeutic Exercise: 8-22 mins                     Kipp LaurenceStephanie R , PT, DPT Supplemental Physical Therapist 11/29/18 9:30 AM Pager: [phone] Office: [phone]

## 2018-12-20 ENCOUNTER — Other Ambulatory Visit (HOSPITAL_COMMUNITY): Payer: Self-pay | Admitting: Orthopaedic Surgery

## 2018-12-20 ENCOUNTER — Emergency Department (HOSPITAL_COMMUNITY)
Admission: EM | Admit: 2018-12-20 | Discharge: 2018-12-20 | Disposition: A | Discharge status: Home / Self Care | Payer: No Typology Code available for payment source | Attending: Emergency Medicine | Admitting: Emergency Medicine

## 2018-12-20 ENCOUNTER — Other Ambulatory Visit: Payer: Self-pay

## 2018-12-20 ENCOUNTER — Encounter (HOSPITAL_COMMUNITY): Payer: Self-pay | Admitting: Emergency Medicine

## 2018-12-20 ENCOUNTER — Ambulatory Visit (HOSPITAL_COMMUNITY)
Admission: RE | Admit: 2018-12-20 | Discharge: 2018-12-20 | Disposition: A | Discharge status: Home / Self Care | Payer: No Typology Code available for payment source | Source: Ambulatory Visit | Attending: Orthopedic Surgery | Admitting: Orthopedic Surgery

## 2018-12-20 DIAGNOSIS — Z7982 Long term (current) use of aspirin: Secondary | ICD-10-CM | POA: Diagnosis not present

## 2018-12-20 DIAGNOSIS — M7989 Other specified soft tissue disorders: Secondary | ICD-10-CM | POA: Insufficient documentation

## 2018-12-20 DIAGNOSIS — Z79899 Other long term (current) drug therapy: Secondary | ICD-10-CM | POA: Diagnosis not present

## 2018-12-20 DIAGNOSIS — F17210 Nicotine dependence, cigarettes, uncomplicated: Secondary | ICD-10-CM | POA: Diagnosis not present

## 2018-12-20 DIAGNOSIS — M79662 Pain in left lower leg: Secondary | ICD-10-CM | POA: Diagnosis present

## 2018-12-20 DIAGNOSIS — M79605 Pain in left leg: Secondary | ICD-10-CM | POA: Insufficient documentation

## 2018-12-20 DIAGNOSIS — Z96652 Presence of left artificial knee joint: Secondary | ICD-10-CM | POA: Insufficient documentation

## 2018-12-20 DIAGNOSIS — I824Y2 Acute embolism and thrombosis of unspecified deep veins of left proximal lower extremity: Secondary | ICD-10-CM | POA: Diagnosis not present

## 2018-12-20 MED ORDER — RIVAROXABAN (XARELTO) EDUCATION KIT FOR DVT/PE PATIENTS
PACK | Freq: Once | Status: DC
  Filled 2018-12-20: qty 1

## 2018-12-20 MED ORDER — RIVAROXABAN 15 MG PO TABS
15.0000 mg | ORAL_TABLET | Freq: Once | ORAL | Status: AC
  Administered 2018-12-20: 15 mg via ORAL
  Filled 2018-12-20: qty 1

## 2018-12-20 MED ORDER — RIVAROXABAN (XARELTO) VTE STARTER PACK (15 & 20 MG): ORAL_TABLET | ORAL | 0 refills | Status: DC

## 2018-12-20 MED FILL — XARELTO STARTER PACK: 15 & 20 | 30 days supply | Qty: 51 | Fill #0

## 2018-12-20 NOTE — ED Notes (Signed)
Pt verbalized understanding of discharge paperwork, prescriptions and follow-up care 

## 2018-12-20 NOTE — Discharge Instructions (Signed)
Thank you for allowing me to care for you today. Please return to the emergency department if you have new or worsening symptoms. Take your medications as instructed.  ° °
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
## 2018-12-20 NOTE — ED Triage Notes (Signed)
Pt reports having knee surgery on his left knee recently and having calf pain. Came from vascular lab where they confirmed he had a DVT. Denies SOB. Pt reports taking tramadol before coming.

## 2018-12-20 NOTE — Progress Notes (Signed)
Lower extremity venous has been completed.   Preliminary results in CV Proc.   Results given to RN.  Blanch Media 12/20/2018 11:22 AM

## 2019-01-08 ENCOUNTER — Telehealth: Payer: Self-pay | Admitting: Oncology

## 2019-01-08 NOTE — Telephone Encounter (Signed)
Received a new patient referral from [facility] for DVT. Pt has been cld and scheduled to see Dr. Alen Blew on 6/25 at 2pm. Pt aware to arrive 20 minutes early. Location to our facility has been provided to the pt.

## 2019-01-10 ENCOUNTER — Telehealth: Payer: Self-pay

## 2019-01-10 NOTE — Telephone Encounter (Signed)
Called and left voicemail regarding pre-screening questions for appt on 6/25  

## 2019-01-11 ENCOUNTER — Inpatient Hospital Stay: Payer: No Typology Code available for payment source | Attending: Oncology | Admitting: Oncology

## 2019-01-11 ENCOUNTER — Other Ambulatory Visit: Payer: Self-pay

## 2019-01-11 VITALS — BP 146/90 | HR 80 | Temp 98.7°F | Resp 18 | Ht 70.0 in | Wt 266.4 lb

## 2019-01-11 DIAGNOSIS — Z86718 Personal history of other venous thrombosis and embolism: Secondary | ICD-10-CM

## 2019-01-11 DIAGNOSIS — I82452 Acute embolism and thrombosis of left peroneal vein: Secondary | ICD-10-CM

## 2019-01-11 DIAGNOSIS — I82432 Acute embolism and thrombosis of left popliteal vein: Secondary | ICD-10-CM | POA: Diagnosis not present

## 2019-01-11 DIAGNOSIS — O2230 Deep phlebothrombosis in pregnancy, unspecified trimester: Secondary | ICD-10-CM

## 2019-01-11 DIAGNOSIS — Z7901 Long term (current) use of anticoagulants: Secondary | ICD-10-CM

## 2019-01-11 DIAGNOSIS — F17210 Nicotine dependence, cigarettes, uncomplicated: Secondary | ICD-10-CM

## 2019-01-11 DIAGNOSIS — Z7289 Other problems related to lifestyle: Secondary | ICD-10-CM

## 2019-01-11 DIAGNOSIS — Z96652 Presence of left artificial knee joint: Secondary | ICD-10-CM

## 2019-01-11 MED ORDER — RIVAROXABAN 20 MG PO TABS: 20.0000 mg | ORAL_TABLET | Freq: Every day | ORAL | 5 refills | Status: DC
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
## 2019-01-12 ENCOUNTER — Telehealth: Payer: Self-pay | Admitting: Oncology

## 2019-01-12 NOTE — Telephone Encounter (Signed)
Scheduled per los. Mailed printout  °

## 2019-03-13 DIAGNOSIS — M25561 Pain in right knee: Secondary | ICD-10-CM | POA: Diagnosis not present

## 2019-03-16 ENCOUNTER — Other Ambulatory Visit (HOSPITAL_COMMUNITY): Payer: Self-pay | Admitting: Orthopedic Surgery

## 2019-03-16 ENCOUNTER — Other Ambulatory Visit: Payer: Self-pay | Admitting: Orthopedic Surgery

## 2019-03-16 DIAGNOSIS — Z1389 Encounter for screening for other disorder: Secondary | ICD-10-CM

## 2019-03-16 DIAGNOSIS — Z0189 Encounter for other specified special examinations: Secondary | ICD-10-CM

## 2019-03-16 DIAGNOSIS — M25561 Pain in right knee: Secondary | ICD-10-CM

## 2019-03-30 ENCOUNTER — Ambulatory Visit (HOSPITAL_COMMUNITY)
Admission: RE | Admit: 2019-03-30 | Discharge: 2019-03-30 | Disposition: A | Discharge status: Home / Self Care | Payer: Medicaid Other | Source: Ambulatory Visit | Attending: Orthopedic Surgery | Admitting: Orthopedic Surgery

## 2019-03-30 ENCOUNTER — Other Ambulatory Visit: Payer: Self-pay

## 2019-03-30 DIAGNOSIS — Z0189 Encounter for other specified special examinations: Secondary | ICD-10-CM

## 2019-03-30 DIAGNOSIS — M25561 Pain in right knee: Secondary | ICD-10-CM | POA: Diagnosis not present

## 2019-03-30 DIAGNOSIS — Z1389 Encounter for screening for other disorder: Secondary | ICD-10-CM | POA: Insufficient documentation

## 2019-03-30 DIAGNOSIS — T170XXA Foreign body in nasal sinus, initial encounter: Secondary | ICD-10-CM | POA: Diagnosis not present

## 2019-04-12 DIAGNOSIS — M25561 Pain in right knee: Secondary | ICD-10-CM | POA: Diagnosis not present

## 2019-04-19 ENCOUNTER — Other Ambulatory Visit: Payer: Self-pay | Admitting: Orthopedic Surgery

## 2019-04-25 ENCOUNTER — Other Ambulatory Visit (HOSPITAL_COMMUNITY): Payer: Self-pay | Admitting: *Deleted

## 2019-04-25 NOTE — Patient Instructions (Addendum)
DUE TO COVID-19 ONLY ONE VISITOR IS ALLOWED TO COME WITH YOU AND STAY IN THE WAITING ROOM ONLY DURING PRE OP AND PROCEDURE DAY OF SURGERY. THE 1 VISITOR MAY VISIT WITH YOU AFTER SURGERY IN YOUR PRIVATE ROOM DURING VISITING HOURS ONLY!  YOU NEED TO HAVE A COVID 19 TEST ON Thursday 05-03-2019 AT 900 am. THIS TEST MUST BE DONE BEFORE SURGERY, COME  [address], [place] , [address].  [facility]) ONCE YOUR COVID TEST IS COMPLETED, PLEASE BEGIN THE QUARANTINE INSTRUCTIONS AS OUTLINED IN YOUR HANDOUT.                Jesse Ruiz    Your procedure is scheduled on: 05-07-2019   Report to [facility] Main  Entrance   Report to admitting at 810 AM     Call this number if you have problems the morning of surgery [phone]    Remember:. BRUSH YOUR TEETH MORNING OF SURGERY AND RINSE YOUR MOUTH OUT, NO CHEWING GUM CANDY OR MINTS.   NO SOLID FOOD AFTER MIDNIGHT THE NIGHT PRIOR TO SURGERY. NOTHING BY MOUTH EXCEPT CLEAR LIQUIDS UNTIL 735 AM . PLEASE FINISH ENSURE DRINK PER SURGEON ORDER  WHICH NEEDS TO BE COMPLETED AT 735 AM .   CLEAR LIQUID DIET   Foods Allowed                                                                     Foods Excluded  Coffee and tea, regular and decaf                             liquids that you cannot  Plain Jell-O any favor except red or purple                                           see through such as: Fruit ices (not with fruit pulp)                                     milk, soups, orange juice  Iced Popsicles                                    All solid food Carbonated beverages, regular and diet                                    Cranberry, grape and apple juices Sports drinks like Gatorade Lightly seasoned clear broth or consume(fat free) Sugar, honey syrup  Sample Menu Breakfast                                Lunch  Supper Cranberry juice                    Beef broth                             Chicken broth Jell-O                                     Grape juice                           Apple juice Coffee or tea                        Jell-O                                      Popsicle                                                Coffee or tea                        Coffee or tea  _____________________________________________________________________    Take these medicines the morning of surgery with A SIP OF WATER: TRAMADOL IF NEEDED                                 You may not have any metal on your body including hair pins and              piercings  Do not wear jewelry, make-up, lotions, powders or perfumes, deodorant                          Men may shave face and neck.   Do not bring valuables to the hospital. [place].  Contacts, dentures or bridgework may not be worn into surgery.  Leave suitcase in the car. After surgery it may be brought to your room.                 Please read over the following fact sheets you were given: _____________________________________________________________________             [facility] - Preparing for Surgery Before surgery, you can play an important role.  Because skin is not sterile, your skin needs to be as free of germs as possible.  You can reduce the number of germs on your skin by washing with CHG (chlorahexidine gluconate) soap before surgery.  CHG is an antiseptic cleaner which kills germs and bonds with the skin to continue killing germs even after washing. Please DO NOT use if you have an allergy to CHG or antibacterial soaps.  If your skin becomes reddened/irritated stop using the CHG and inform your nurse when you arrive at Short Stay. Do not shave (including legs and underarms) for at least 48 hours prior to the first  CHG shower.  You may shave your face/neck. Please follow these instructions carefully:  1.  Shower with CHG Soap the night before surgery and  the  morning of Surgery.  2.  If you choose to wash your hair, wash your hair first as usual with your  normal  shampoo.  3.  After you shampoo, rinse your hair and body thoroughly to remove the  shampoo.                           4.  Use CHG as you would any other liquid soap.  You can apply chg directly  to the skin and wash                       Gently with a scrungie or clean washcloth.  5.  Apply the CHG Soap to your body ONLY FROM THE NECK DOWN.   Do not use on face/ open                           Wound or open sores. Avoid contact with eyes, ears mouth and genitals (private parts).                       Wash face,  Genitals (private parts) with your normal soap.             6.  Wash thoroughly, paying special attention to the area where your surgery  will be performed.  7.  Thoroughly rinse your body with warm water from the neck down.  8.  DO NOT shower/wash with your normal soap after using and rinsing off  the CHG Soap.                9.  Pat yourself dry with a clean towel.            10.  Wear clean pajamas.            11.  Place clean sheets on your bed the night of your first shower and do not  sleep with pets. Day of Surgery : Do not apply any lotions/deodorants the morning of surgery.  Please wear clean clothes to the hospital/surgery center.  FAILURE TO FOLLOW THESE INSTRUCTIONS MAY RESULT IN THE CANCELLATION OF YOUR SURGERY PATIENT SIGNATURE_________________________________  NURSE SIGNATURE__________________________________  ________________________________________________________________________   Jesse MireIncentive Spirometer  An incentive spirometer is a tool that can help keep your lungs clear and active. This tool measures how well you are filling your lungs with each breath. Taking long deep breaths may help reverse or decrease the chance of developing breathing (pulmonary) problems (especially infection) following:  A long period of time when you are unable to move or be  active. BEFORE THE PROCEDURE   If the spirometer includes an indicator to show your best effort, your nurse or respiratory therapist will set it to a desired goal.  If possible, sit up straight or lean slightly forward. Try not to slouch.  Hold the incentive spirometer in an upright position. INSTRUCTIONS FOR USE  1. Sit on the edge of your bed if possible, or sit up as far as you can in bed or on a chair. 2. Hold the incentive spirometer in an upright position. 3. Breathe out normally. 4. Place the mouthpiece in your mouth and seal your lips tightly around it. 5. Breathe in slowly and as  deeply as possible, raising the piston or the ball toward the top of the column. 6. Hold your breath for 3-5 seconds or for as long as possible. Allow the piston or ball to fall to the bottom of the column. 7. Remove the mouthpiece from your mouth and breathe out normally. 8. Rest for a few seconds and repeat Steps 1 through 7 at least 10 times every 1-2 hours when you are awake. Take your time and take a few normal breaths between deep breaths. 9. The spirometer may include an indicator to show your best effort. Use the indicator as a goal to work toward during each repetition. 10. After each set of 10 deep breaths, practice coughing to be sure your lungs are clear. If you have an incision (the cut made at the time of surgery), support your incision when coughing by placing a pillow or rolled up towels firmly against it. Once you are able to get out of bed, walk around indoors and cough well. You may stop using the incentive spirometer when instructed by your caregiver.  RISKS AND COMPLICATIONS  Take your time so you do not get dizzy or light-headed.  If you are in pain, you may need to take or ask for pain medication before doing incentive spirometry. It is harder to take a deep breath if you are having pain. AFTER USE  Rest and breathe slowly and easily.  It can be helpful to keep track of a log of  your progress. Your caregiver can provide you with a simple table to help with this. If you are using the spirometer at home, follow these instructions: [place] IF:   You are having difficultly using the spirometer.  You have trouble using the spirometer as often as instructed.  Your pain medication is not giving enough relief while using the spirometer.  You develop fever of 100.5 F (38.1 C) or higher. SEEK IMMEDIATE MEDICAL CARE IF:   You cough up bloody sputum that had not been present before.  You develop fever of 102 F (38.9 C) or greater.  You develop worsening pain at or near the incision site. MAKE SURE YOU:   Understand these instructions.  Will watch your condition.  Will get help right away if you are not doing well or get worse. Document Released: 11/15/2006 Document Revised: 09/27/2011 Document Reviewed: 01/16/2007 ExitCare Patient Information 2014 [employer], [place].   ________________________________________________________________________  WHAT IS A BLOOD TRANSFUSION? Blood Transfusion Information  A transfusion is the replacement of blood or some of its parts. Blood is made up of multiple cells which provide different functions.  Red blood cells carry oxygen and are used for blood loss replacement.  White blood cells fight against infection.  Platelets control bleeding.  Plasma helps clot blood.  Other blood products are available for specialized needs, such as hemophilia or other clotting disorders. BEFORE THE TRANSFUSION  Who gives blood for transfusions?   Healthy volunteers who are fully evaluated to make sure their blood is safe. This is blood bank blood. Transfusion therapy is the safest it has ever been in the practice of medicine. Before blood is taken from a donor, a complete history is taken to make sure that person has no history of diseases nor engages in risky social behavior (examples are intravenous drug use or sexual activity  with multiple partners). The donor's travel history is screened to minimize risk of transmitting infections, such as malaria. The donated blood is tested for signs of infectious  diseases, such as HIV and hepatitis. The blood is then tested to be sure it is compatible with you in order to minimize the chance of a transfusion reaction. If you or a relative donates blood, this is often done in anticipation of surgery and is not appropriate for emergency situations. It takes many days to process the donated blood. RISKS AND COMPLICATIONS Although transfusion therapy is very safe and saves many lives, the main dangers of transfusion include:   Getting an infectious disease.  Developing a transfusion reaction. This is an allergic reaction to something in the blood you were given. Every precaution is taken to prevent this. The decision to have a blood transfusion has been considered carefully by your caregiver before blood is given. Blood is not given unless the benefits outweigh the risks. AFTER THE TRANSFUSION  Right after receiving a blood transfusion, you will usually feel much better and more energetic. This is especially true if your red blood cells have gotten low (anemic). The transfusion raises the level of the red blood cells which carry oxygen, and this usually causes an energy increase.  The nurse administering the transfusion will monitor you carefully for complications. HOME CARE INSTRUCTIONS  No special instructions are needed after a transfusion. You may find your energy is better. Speak with your caregiver about any limitations on activity for underlying diseases you may have. SEEK MEDICAL CARE IF:   Your condition is not improving after your transfusion.  You develop redness or irritation at the intravenous (IV) site. SEEK IMMEDIATE MEDICAL CARE IF:  Any of the following symptoms occur over the next 12 hours:  Shaking chills.  You have a temperature by mouth above 102 F (38.9  C), not controlled by medicine.  Chest, back, or muscle pain.  People around you feel you are not acting correctly or are confused.  Shortness of breath or difficulty breathing.  Dizziness and fainting.  You get a rash or develop hives.  You have a decrease in urine output.  Your urine turns a dark color or changes to pink, red, or brown. Any of the following symptoms occur over the next 10 days:  You have a temperature by mouth above 102 F (38.9 C), not controlled by medicine.  Shortness of breath.  Weakness after normal activity.  The white part of the eye turns yellow (jaundice).  You have a decrease in the amount of urine or are urinating less often.  Your urine turns a dark color or changes to pink, red, or brown. Document Released: 07/02/2000 Document Revised: 09/27/2011 Document Reviewed: 02/19/2008 [facility] Patient Information 2014 [place], [place].  _______________________________________________________________________

## 2019-04-28 DIAGNOSIS — R062 Wheezing: Secondary | ICD-10-CM | POA: Diagnosis not present

## 2019-04-28 DIAGNOSIS — J0101 Acute recurrent maxillary sinusitis: Secondary | ICD-10-CM | POA: Diagnosis not present

## 2019-04-30 ENCOUNTER — Other Ambulatory Visit: Payer: Self-pay

## 2019-04-30 ENCOUNTER — Encounter (HOSPITAL_COMMUNITY)
Admission: RE | Admit: 2019-04-30 | Discharge: 2019-04-30 | Disposition: A | Discharge status: Home / Self Care | Payer: Medicaid Other | Source: Ambulatory Visit | Attending: Orthopedic Surgery | Admitting: Orthopedic Surgery

## 2019-04-30 ENCOUNTER — Encounter (HOSPITAL_COMMUNITY): Payer: Self-pay

## 2019-04-30 ENCOUNTER — Encounter (INDEPENDENT_AMBULATORY_CARE_PROVIDER_SITE_OTHER): Payer: Self-pay

## 2019-04-30 DIAGNOSIS — Z01812 Encounter for preprocedural laboratory examination: Secondary | ICD-10-CM | POA: Insufficient documentation

## 2019-04-30 HISTORY — DX: Chronic sinusitis, unspecified: J32.9

## 2019-04-30 LAB — BASIC METABOLIC PANEL
Anion gap: 9 (ref 5–15)
BUN: 17 mg/dL (ref 6–20)
CO2: 23 mmol/L (ref 22–32)
Calcium: 9.3 mg/dL (ref 8.9–10.3)
Chloride: 102 mmol/L (ref 98–111)
Creatinine, Ser: 0.66 mg/dL (ref 0.61–1.24)
GFR calc Af Amer: >60 mL/min (ref >60)
GFR calc non Af Amer: >60 mL/min (ref >60)
Glucose, Bld: 93 mg/dL (ref 70–99)
Potassium: 4.5 mmol/L (ref 3.5–5.1)
Sodium: 134 mmol/L — ABNORMAL LOW (ref 135–145)

## 2019-04-30 LAB — CBC WITH DIFFERENTIAL/PLATELET
Abs Immature Granulocytes: 0.02 10*3/uL (ref 0.00–0.07)
Basophils Absolute: 0.1 10*3/uL (ref 0.0–0.1)
Basophils Relative: 1 %
Eosinophils Absolute: 0.4 10*3/uL (ref 0.0–0.5)
Eosinophils Relative: 6 %
HCT: 47.6 % (ref 39.0–52.0)
Hemoglobin: 16.2 g/dL (ref 13.0–17.0)
Immature Granulocytes: 0 %
Lymphocytes Relative: 26 %
Lymphs Abs: 1.9 10*3/uL (ref 0.7–4.0)
MCH: 31.2 pg (ref 26.0–34.0)
MCHC: 34 g/dL (ref 30.0–36.0)
MCV: 91.5 fL (ref 80.0–100.0)
Monocytes Absolute: 0.6 10*3/uL (ref 0.1–1.0)
Monocytes Relative: 9 %
Neutro Abs: 4.2 10*3/uL (ref 1.7–7.7)
Neutrophils Relative %: 58 %
Platelets: 178 10*3/uL (ref 150–400)
RBC: 5.2 MIL/uL (ref 4.22–5.81)
RDW: 12.5 % (ref 11.5–15.5)
WBC: 7.2 10*3/uL (ref 4.0–10.5)
nRBC: 0 % (ref 0.0–0.2)

## 2019-04-30 LAB — APTT: aPTT: 28 seconds (ref 24–36)

## 2019-04-30 LAB — SURGICAL PCR SCREEN
MRSA, PCR: NEGATIVE
Staphylococcus aureus: NEGATIVE

## 2019-04-30 LAB — URINALYSIS, ROUTINE W REFLEX MICROSCOPIC
Bilirubin Urine: NEGATIVE
Glucose, UA: NEGATIVE mg/dL
Hgb urine dipstick: NEGATIVE
Ketones, ur: NEGATIVE mg/dL
Leukocytes,Ua: NEGATIVE
Nitrite: NEGATIVE
Protein, ur: NEGATIVE mg/dL
Specific Gravity, Urine: 1.018 (ref 1.005–1.030)
pH: 5 (ref 5.0–8.0)

## 2019-04-30 LAB — PROTIME-INR
INR: 1 (ref 0.8–1.2)
Prothrombin Time: 13.1 seconds (ref 11.4–15.2)

## 2019-04-30 NOTE — Progress Notes (Addendum)
PCP - none Cardiologist - none  Chest x-ray - none EKG - 11-23-18 epic Stress Test - none ECHO - none Cardiac Cath - none  Sleep Study - noneCPAP - none  Fasting Blood Sugar - n/a Checks Blood Sugar _____ times a day  Blood Thinner Instructions:xarelto stop 2 days prior to surgery per dr Mayer Camel Aspirin Instructions:none Last Dose:  Anesthesia review: blood pressure elevated at pre op 175/102, 177/110 (elevated at pre op for 11-27-18 surgery also) on amoxicillian 500 mg bid sinus 04-28-19 for sinus infection. CHART TO JESSICA ZANETTO PA FOR REVIEW  Patient denies shortness of breath, fever, cough and chest pain at PAT appointment   Patient verbalized understanding of instructions that were given to them at the PAT appointment. Patient was also instructed that they will need to review over the PAT instructions again at home before surgery.

## 2019-05-02 ENCOUNTER — Telehealth: Payer: Self-pay

## 2019-05-02 NOTE — Telephone Encounter (Signed)
Iver Nestle, PA called stating patient is scheduled for knee surgery on 10/19. She wants to know if Dr. Alen Blew is ok with patient stopping Xarelto for the surgery. Per Dr. Alen Blew, it is ok to hold Xarelto. Janett Billow made aware.
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
## 2019-05-03 ENCOUNTER — Inpatient Hospital Stay (HOSPITAL_COMMUNITY): Admission: RE | Admit: 2019-05-03 | Payer: Medicaid Other | Source: Ambulatory Visit

## 2019-05-03 ENCOUNTER — Other Ambulatory Visit (HOSPITAL_COMMUNITY)
Admission: RE | Admit: 2019-05-03 | Discharge: 2019-05-03 | Disposition: A | Discharge status: Home / Self Care | Payer: Medicaid Other | Source: Ambulatory Visit | Attending: Orthopedic Surgery | Admitting: Orthopedic Surgery

## 2019-05-03 DIAGNOSIS — I10 Essential (primary) hypertension: Secondary | ICD-10-CM | POA: Diagnosis not present

## 2019-05-03 DIAGNOSIS — Z01812 Encounter for preprocedural laboratory examination: Secondary | ICD-10-CM | POA: Insufficient documentation

## 2019-05-03 DIAGNOSIS — M87051 Idiopathic aseptic necrosis of right femur: Secondary | ICD-10-CM | POA: Diagnosis present

## 2019-05-03 DIAGNOSIS — Z20828 Contact with and (suspected) exposure to other viral communicable diseases: Secondary | ICD-10-CM | POA: Insufficient documentation
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
## 2019-05-05 LAB — NOVEL CORONAVIRUS, NAA (HOSP ORDER, SEND-OUT TO REF LAB; TAT 18-24 HRS): SARS-CoV-2, NAA: NOT DETECTED

## 2019-05-06 MED ORDER — DEXTROSE 5 % IV SOLN
3.0000 g | INTRAVENOUS | Status: AC
  Administered 2019-05-07: 11:00:00 3 g via INTRAVENOUS
  Filled 2019-05-06: qty 3

## 2019-05-06 MED ORDER — TRANEXAMIC ACID 1000 MG/10ML IV SOLN
2000.0000 mg | INTRAVENOUS | Status: DC
  Filled 2019-05-06: qty 20

## 2019-05-06 MED ORDER — BUPIVACAINE LIPOSOME 1.3 % IJ SUSP
20.0000 mL | Freq: Once | INTRAMUSCULAR | Status: DC
  Filled 2019-05-06: qty 20

## 2019-05-07 ENCOUNTER — Other Ambulatory Visit: Payer: Self-pay

## 2019-05-07 ENCOUNTER — Ambulatory Visit (HOSPITAL_COMMUNITY): Payer: Medicaid Other | Admitting: Physician Assistant

## 2019-05-07 ENCOUNTER — Observation Stay (HOSPITAL_COMMUNITY)
Admission: RE | Admit: 2019-05-07 | Discharge: 2019-05-08 | Disposition: A | Discharge status: Home / Self Care | Payer: Medicaid Other | Source: Other Acute Inpatient Hospital | Attending: Orthopedic Surgery | Admitting: Orthopedic Surgery

## 2019-05-07 ENCOUNTER — Encounter (HOSPITAL_COMMUNITY): Payer: Self-pay | Admitting: Anesthesiology

## 2019-05-07 ENCOUNTER — Encounter (HOSPITAL_COMMUNITY)
Admission: RE | Disposition: A | Discharge status: Home / Self Care | Payer: Self-pay | Source: Other Acute Inpatient Hospital | Attending: Orthopedic Surgery

## 2019-05-07 ENCOUNTER — Ambulatory Visit (HOSPITAL_COMMUNITY): Payer: Medicaid Other | Admitting: Anesthesiology

## 2019-05-07 DIAGNOSIS — M25761 Osteophyte, right knee: Secondary | ICD-10-CM | POA: Insufficient documentation

## 2019-05-07 DIAGNOSIS — M879 Osteonecrosis, unspecified: Principal | ICD-10-CM | POA: Insufficient documentation

## 2019-05-07 DIAGNOSIS — Z86711 Personal history of pulmonary embolism: Secondary | ICD-10-CM | POA: Diagnosis not present

## 2019-05-07 DIAGNOSIS — F17210 Nicotine dependence, cigarettes, uncomplicated: Secondary | ICD-10-CM | POA: Insufficient documentation

## 2019-05-07 DIAGNOSIS — Z96652 Presence of left artificial knee joint: Secondary | ICD-10-CM | POA: Diagnosis not present

## 2019-05-07 DIAGNOSIS — Z7901 Long term (current) use of anticoagulants: Secondary | ICD-10-CM | POA: Diagnosis not present

## 2019-05-07 DIAGNOSIS — E669 Obesity, unspecified: Secondary | ICD-10-CM | POA: Diagnosis not present

## 2019-05-07 DIAGNOSIS — M1712 Unilateral primary osteoarthritis, left knee: Secondary | ICD-10-CM | POA: Diagnosis not present

## 2019-05-07 DIAGNOSIS — Z86718 Personal history of other venous thrombosis and embolism: Secondary | ICD-10-CM | POA: Diagnosis not present

## 2019-05-07 DIAGNOSIS — I10 Essential (primary) hypertension: Secondary | ICD-10-CM | POA: Diagnosis not present

## 2019-05-07 DIAGNOSIS — M87051 Idiopathic aseptic necrosis of right femur: Secondary | ICD-10-CM | POA: Diagnosis not present

## 2019-05-07 DIAGNOSIS — M1711 Unilateral primary osteoarthritis, right knee: Secondary | ICD-10-CM | POA: Diagnosis not present

## 2019-05-07 DIAGNOSIS — G8918 Other acute postprocedural pain: Secondary | ICD-10-CM | POA: Diagnosis not present

## 2019-05-07 DIAGNOSIS — M25561 Pain in right knee: Secondary | ICD-10-CM | POA: Diagnosis present

## 2019-05-07 DIAGNOSIS — Z6838 Body mass index (BMI) 38.0-38.9, adult: Secondary | ICD-10-CM | POA: Diagnosis not present

## 2019-05-07 DIAGNOSIS — Z96651 Presence of right artificial knee joint: Secondary | ICD-10-CM

## 2019-05-07 HISTORY — PX: TOTAL KNEE ARTHROPLASTY: SHX125

## 2019-05-07 LAB — TYPE AND SCREEN
ABO/RH(D): B POS
Antibody Screen: NEGATIVE

## 2019-05-07 SURGERY — ARTHROPLASTY, KNEE, TOTAL
Anesthesia: Monitor Anesthesia Care | Site: Knee | Laterality: Right

## 2019-05-07 MED ORDER — PROPOFOL 10 MG/ML IV BOLUS
INTRAVENOUS | Status: AC
  Filled 2019-05-07: qty 20

## 2019-05-07 MED ORDER — TRAMADOL HCL 50 MG PO TABS
50.0000 mg | ORAL_TABLET | Freq: Four times a day (QID) | ORAL | Status: DC | PRN
  Administered 2019-05-07 – 2019-05-08 (×2): 100 mg via ORAL
  Filled 2019-05-07 (×3): qty 2

## 2019-05-07 MED ORDER — FENTANYL CITRATE (PF) 100 MCG/2ML IJ SOLN
50.0000 ug | INTRAMUSCULAR | Status: DC
  Administered 2019-05-07: 10:00:00 50 ug via INTRAVENOUS
  Filled 2019-05-07: qty 2

## 2019-05-07 MED ORDER — TRANEXAMIC ACID 1000 MG/10ML IV SOLN
INTRAVENOUS | Status: DC | PRN
  Administered 2019-05-07: 2000 mg via TOPICAL

## 2019-05-07 MED ORDER — LIDOCAINE HCL (CARDIAC) PF 100 MG/5ML IV SOSY
PREFILLED_SYRINGE | INTRAVENOUS | Status: DC | PRN
  Administered 2019-05-07: 30 mg via INTRAVENOUS

## 2019-05-07 MED ORDER — BUPIVACAINE IN DEXTROSE 0.75-8.25 % IT SOLN
INTRATHECAL | Status: DC | PRN
  Administered 2019-05-07: 15 mg via INTRATHECAL

## 2019-05-07 MED ORDER — MIDAZOLAM HCL 5 MG/5ML IJ SOLN
INTRAMUSCULAR | Status: DC | PRN
  Administered 2019-05-07: 1 mg via INTRAVENOUS

## 2019-05-07 MED ORDER — METHOCARBAMOL 500 MG PO TABS: 500.0000 mg | ORAL_TABLET | Freq: Two times a day (BID) | ORAL | 0 refills | Status: DC

## 2019-05-07 MED ORDER — METOCLOPRAMIDE HCL 5 MG PO TABS: 5.0000 mg | ORAL_TABLET | Freq: Three times a day (TID) | ORAL | Status: DC | PRN

## 2019-05-07 MED ORDER — ALBUTEROL SULFATE (2.5 MG/3ML) 0.083% IN NEBU: 2.5000 mg | INHALATION_SOLUTION | Freq: Four times a day (QID) | RESPIRATORY_TRACT | Status: DC | PRN

## 2019-05-07 MED ORDER — SODIUM CHLORIDE (PF) 0.9 % IJ SOLN
INTRAMUSCULAR | Status: DC | PRN
  Administered 2019-05-07: 70 mL

## 2019-05-07 MED ORDER — DIPHENHYDRAMINE HCL 12.5 MG/5ML PO ELIX: 12.5000 mg | ORAL_SOLUTION | ORAL | Status: DC | PRN

## 2019-05-07 MED ORDER — KETOROLAC TROMETHAMINE 30 MG/ML IJ SOLN: 30.0000 mg | Freq: Once | INTRAMUSCULAR | Status: DC | PRN

## 2019-05-07 MED ORDER — BUPIVACAINE LIPOSOME 1.3 % IJ SUSP
INTRAMUSCULAR | Status: DC | PRN
  Administered 2019-05-07: 20 mL

## 2019-05-07 MED ORDER — KCL IN DEXTROSE-NACL 20-5-0.45 MEQ/L-%-% IV SOLN
INTRAVENOUS | Status: DC
  Administered 2019-05-07 – 2019-05-08 (×2): via INTRAVENOUS
  Filled 2019-05-07 (×3): qty 1000

## 2019-05-07 MED ORDER — METHOCARBAMOL 500 MG IVPB - SIMPLE MED
500.0000 mg | Freq: Four times a day (QID) | INTRAVENOUS | Status: DC | PRN
  Filled 2019-05-07: qty 50

## 2019-05-07 MED ORDER — EPHEDRINE 5 MG/ML INJ
INTRAVENOUS | Status: AC
  Filled 2019-05-07: qty 10

## 2019-05-07 MED ORDER — 0.9 % SODIUM CHLORIDE (POUR BTL) OPTIME
TOPICAL | Status: DC | PRN
  Administered 2019-05-07: 13:00:00 1000 mL

## 2019-05-07 MED ORDER — TRANEXAMIC ACID-NACL 1000-0.7 MG/100ML-% IV SOLN
1000.0000 mg | Freq: Once | INTRAVENOUS | Status: AC
  Administered 2019-05-07: 16:00:00 1000 mg via INTRAVENOUS
  Filled 2019-05-07: qty 100

## 2019-05-07 MED ORDER — PANTOPRAZOLE SODIUM 40 MG PO TBEC
40.0000 mg | DELAYED_RELEASE_TABLET | Freq: Every day | ORAL | Status: DC
  Administered 2019-05-07 – 2019-05-08 (×2): 40 mg via ORAL
  Filled 2019-05-07 (×2): qty 1

## 2019-05-07 MED ORDER — LISINOPRIL 5 MG PO TABS
5.0000 mg | ORAL_TABLET | Freq: Every day | ORAL | Status: DC
  Administered 2019-05-08: 10:00:00 5 mg via ORAL
  Filled 2019-05-07: qty 1

## 2019-05-07 MED ORDER — POVIDONE-IODINE 10 % EX SWAB
2.0000 "application " | Freq: Once | CUTANEOUS | Status: AC
  Administered 2019-05-07: 2 via TOPICAL

## 2019-05-07 MED ORDER — DEXAMETHASONE SODIUM PHOSPHATE 10 MG/ML IJ SOLN
INTRAMUSCULAR | Status: AC
  Filled 2019-05-07: qty 1

## 2019-05-07 MED ORDER — GABAPENTIN 300 MG PO CAPS
300.0000 mg | ORAL_CAPSULE | Freq: Three times a day (TID) | ORAL | Status: DC
  Administered 2019-05-07 – 2019-05-08 (×3): 300 mg via ORAL
  Filled 2019-05-07 (×3): qty 1

## 2019-05-07 MED ORDER — BUPIVACAINE HCL (PF) 0.25 % IJ SOLN
INTRAMUSCULAR | Status: AC
  Filled 2019-05-07: qty 30

## 2019-05-07 MED ORDER — DOCUSATE SODIUM 100 MG PO CAPS
100.0000 mg | ORAL_CAPSULE | Freq: Two times a day (BID) | ORAL | Status: DC
  Administered 2019-05-07 – 2019-05-08 (×2): 100 mg via ORAL
  Filled 2019-05-07 (×3): qty 1

## 2019-05-07 MED ORDER — RIVAROXABAN 10 MG PO TABS
10.0000 mg | ORAL_TABLET | Freq: Every day | ORAL | Status: DC
  Administered 2019-05-08: 08:00:00 10 mg via ORAL
  Filled 2019-05-07: qty 1

## 2019-05-07 MED ORDER — TRANEXAMIC ACID-NACL 1000-0.7 MG/100ML-% IV SOLN
1000.0000 mg | INTRAVENOUS | Status: AC
  Administered 2019-05-07: 1000 mg via INTRAVENOUS
  Filled 2019-05-07: qty 100

## 2019-05-07 MED ORDER — MIDAZOLAM HCL 2 MG/2ML IJ SOLN
1.0000 mg | INTRAMUSCULAR | Status: DC
  Administered 2019-05-07: 10:00:00 2 mg via INTRAVENOUS
  Filled 2019-05-07: qty 2

## 2019-05-07 MED ORDER — METOCLOPRAMIDE HCL 5 MG/ML IJ SOLN: 5.0000 mg | Freq: Three times a day (TID) | INTRAMUSCULAR | Status: DC | PRN

## 2019-05-07 MED ORDER — EPHEDRINE SULFATE 50 MG/ML IJ SOLN
INTRAMUSCULAR | Status: DC | PRN
  Administered 2019-05-07 (×2): 5 mg via INTRAVENOUS

## 2019-05-07 MED ORDER — ONDANSETRON HCL 4 MG/2ML IJ SOLN
INTRAMUSCULAR | Status: AC
  Filled 2019-05-07: qty 2

## 2019-05-07 MED ORDER — DEXAMETHASONE SODIUM PHOSPHATE 10 MG/ML IJ SOLN
INTRAMUSCULAR | Status: DC | PRN
  Administered 2019-05-07: 10 mg
  Administered 2019-05-07: 8 mg via INTRAVENOUS

## 2019-05-07 MED ORDER — ONDANSETRON HCL 4 MG/2ML IJ SOLN: 4.0000 mg | Freq: Four times a day (QID) | INTRAMUSCULAR | Status: DC | PRN

## 2019-05-07 MED ORDER — RIVAROXABAN 20 MG PO TABS: 20.0000 mg | ORAL_TABLET | Freq: Every day | ORAL | 5 refills | Status: DC

## 2019-05-07 MED ORDER — PROMETHAZINE HCL 25 MG/ML IJ SOLN: 6.2500 mg | INTRAMUSCULAR | Status: DC | PRN

## 2019-05-07 MED ORDER — METHOCARBAMOL 500 MG PO TABS
500.0000 mg | ORAL_TABLET | Freq: Four times a day (QID) | ORAL | Status: DC | PRN
  Administered 2019-05-07 – 2019-05-08 (×2): 500 mg via ORAL
  Filled 2019-05-07 (×2): qty 1

## 2019-05-07 MED ORDER — PHENOL 1.4 % MT LIQD: 1.0000 | OROMUCOSAL | Status: DC | PRN

## 2019-05-07 MED ORDER — ONDANSETRON HCL 4 MG/2ML IJ SOLN
INTRAMUSCULAR | Status: DC | PRN
  Administered 2019-05-07: 4 mg via INTRAVENOUS

## 2019-05-07 MED ORDER — HYDROCODONE-ACETAMINOPHEN 7.5-325 MG PO TABS: 1.0000 | ORAL_TABLET | Freq: Once | ORAL | Status: DC | PRN

## 2019-05-07 MED ORDER — LIDOCAINE 2% (20 MG/ML) 5 ML SYRINGE
INTRAMUSCULAR | Status: AC
  Filled 2019-05-07: qty 5

## 2019-05-07 MED ORDER — WATER FOR IRRIGATION, STERILE IR SOLN
Status: DC | PRN
  Administered 2019-05-07: 2000 mL

## 2019-05-07 MED ORDER — PROPOFOL 500 MG/50ML IV EMUL
INTRAVENOUS | Status: DC | PRN
  Administered 2019-05-07: 50 ug/kg/min via INTRAVENOUS

## 2019-05-07 MED ORDER — ACETAMINOPHEN 500 MG PO TABS
1000.0000 mg | ORAL_TABLET | Freq: Once | ORAL | Status: AC
  Administered 2019-05-07: 1000 mg via ORAL
  Filled 2019-05-07: qty 2

## 2019-05-07 MED ORDER — ONDANSETRON HCL 4 MG PO TABS: 4.0000 mg | ORAL_TABLET | Freq: Four times a day (QID) | ORAL | Status: DC | PRN

## 2019-05-07 MED ORDER — FENTANYL CITRATE (PF) 100 MCG/2ML IJ SOLN
INTRAMUSCULAR | Status: DC | PRN
  Administered 2019-05-07: 50 ug via INTRAVENOUS

## 2019-05-07 MED ORDER — BUPIVACAINE HCL (PF) 0.5 % IJ SOLN
INTRAMUSCULAR | Status: DC | PRN
  Administered 2019-05-07: 20 mL via PERINEURAL

## 2019-05-07 MED ORDER — SODIUM CHLORIDE (PF) 0.9 % IJ SOLN
INTRAMUSCULAR | Status: AC
  Filled 2019-05-07: qty 20

## 2019-05-07 MED ORDER — HYDROMORPHONE HCL 1 MG/ML IJ SOLN
0.5000 mg | INTRAMUSCULAR | Status: DC | PRN
  Administered 2019-05-07: 18:00:00 0.5 mg via INTRAVENOUS
  Administered 2019-05-07: 1 mg via INTRAVENOUS
  Filled 2019-05-07 (×2): qty 1

## 2019-05-07 MED ORDER — HYDROMORPHONE HCL 1 MG/ML IJ SOLN: 0.2500 mg | INTRAMUSCULAR | Status: DC | PRN

## 2019-05-07 MED ORDER — ACETAMINOPHEN 325 MG PO TABS: 325.0000 mg | ORAL_TABLET | Freq: Four times a day (QID) | ORAL | Status: DC | PRN

## 2019-05-07 MED ORDER — PROPOFOL 500 MG/50ML IV EMUL
INTRAVENOUS | Status: AC
  Filled 2019-05-07: qty 50

## 2019-05-07 MED ORDER — BISACODYL 5 MG PO TBEC: 5.0000 mg | DELAYED_RELEASE_TABLET | Freq: Every day | ORAL | Status: DC | PRN

## 2019-05-07 MED ORDER — POLYETHYLENE GLYCOL 3350 17 G PO PACK: 17.0000 g | PACK | Freq: Every day | ORAL | Status: DC | PRN

## 2019-05-07 MED ORDER — OXYCODONE HCL 5 MG PO TABS
5.0000 mg | ORAL_TABLET | ORAL | Status: DC | PRN
  Administered 2019-05-08: 10:00:00 10 mg via ORAL
  Filled 2019-05-07 (×2): qty 2

## 2019-05-07 MED ORDER — SODIUM CHLORIDE 0.9 % IR SOLN
Status: DC | PRN
  Administered 2019-05-07: 1000 mL

## 2019-05-07 MED ORDER — MEPERIDINE HCL 50 MG/ML IJ SOLN: 6.2500 mg | INTRAMUSCULAR | Status: DC | PRN

## 2019-05-07 MED ORDER — CHLORHEXIDINE GLUCONATE 4 % EX LIQD: 60.0000 mL | Freq: Once | CUTANEOUS | Status: DC

## 2019-05-07 MED ORDER — CELECOXIB 200 MG PO CAPS
200.0000 mg | ORAL_CAPSULE | Freq: Two times a day (BID) | ORAL | Status: DC
  Administered 2019-05-07 – 2019-05-08 (×2): 200 mg via ORAL
  Filled 2019-05-07 (×2): qty 1

## 2019-05-07 MED ORDER — FENTANYL CITRATE (PF) 100 MCG/2ML IJ SOLN
INTRAMUSCULAR | Status: AC
  Filled 2019-05-07: qty 2

## 2019-05-07 MED ORDER — LACTATED RINGERS IV SOLN
INTRAVENOUS | Status: DC
  Administered 2019-05-07 (×2): via INTRAVENOUS

## 2019-05-07 MED ORDER — ALUM & MAG HYDROXIDE-SIMETH 200-200-20 MG/5ML PO SUSP: 30.0000 mL | ORAL | Status: DC | PRN

## 2019-05-07 MED ORDER — SODIUM CHLORIDE (PF) 0.9 % IJ SOLN
INTRAMUSCULAR | Status: AC
  Filled 2019-05-07: qty 50

## 2019-05-07 MED ORDER — HYDROMORPHONE HCL 4 MG PO TABS: 2.0000 mg | ORAL_TABLET | ORAL | 0 refills | Status: DC | PRN

## 2019-05-07 MED ORDER — FLEET ENEMA 7-19 GM/118ML RE ENEM: 1.0000 | ENEMA | Freq: Once | RECTAL | Status: DC | PRN

## 2019-05-07 MED ORDER — MIDAZOLAM HCL 2 MG/2ML IJ SOLN
INTRAMUSCULAR | Status: AC
  Filled 2019-05-07: qty 2

## 2019-05-07 MED ORDER — BUPIVACAINE HCL (PF) 0.25 % IJ SOLN
INTRAMUSCULAR | Status: DC | PRN
  Administered 2019-05-07: 30 mL via INTRA_ARTICULAR

## 2019-05-07 MED ORDER — ADULT MULTIVITAMIN W/MINERALS CH
1.0000 | ORAL_TABLET | Freq: Every day | ORAL | Status: DC
  Administered 2019-05-07 – 2019-05-08 (×2): 1 via ORAL
  Filled 2019-05-07 (×2): qty 1

## 2019-05-07 MED ORDER — MENTHOL 3 MG MT LOZG: 1.0000 | LOZENGE | OROMUCOSAL | Status: DC | PRN

## 2019-05-07 SURGICAL SUPPLY — 49 items
ATTUNE MED DOME PAT 41 KNEE (Knees) ×2 IMPLANT
ATTUNE PS FEM RT SZ 8 CEM KNEE (Femur) ×2 IMPLANT
ATTUNE PSRP INSR SZ8 5 KNEE (Insert) ×2 IMPLANT
BAG DECANTER FOR FLEXI CONT (MISCELLANEOUS) ×6 IMPLANT
BAG ZIPLOCK 12X15 (MISCELLANEOUS) ×2 IMPLANT
BASE TIBIAL ATTUNE KNEE SZ9 (Knees) ×1 IMPLANT
BLADE SAG 18X100X1.27 (BLADE) ×2 IMPLANT
BLADE SAW SGTL 11.0X1.19X90.0M (BLADE) ×2 IMPLANT
BLADE SURG SZ10 CARB STEEL (BLADE) ×4 IMPLANT
BNDG ELASTIC 6X10 VLCR STRL LF (GAUZE/BANDAGES/DRESSINGS) ×2 IMPLANT
BOWL SMART MIX CTS (DISPOSABLE) ×2 IMPLANT
CEMENT HV SMART SET (Cement) ×4 IMPLANT
COVER SURGICAL LIGHT HANDLE (MISCELLANEOUS) ×2 IMPLANT
COVER WAND RF STERILE (DRAPES) IMPLANT
CUFF TOURN SGL QUICK 34 (TOURNIQUET CUFF) ×1
CUFF TRNQT CYL 34X4.125X (TOURNIQUET CUFF) ×1 IMPLANT
DECANTER SPIKE VIAL GLASS SM (MISCELLANEOUS) ×6 IMPLANT
DRAPE U-SHAPE 47X51 STRL (DRAPES) ×2 IMPLANT
DRSG AQUACEL AG ADV 3.5X10 (GAUZE/BANDAGES/DRESSINGS) ×2 IMPLANT
DURAPREP 26ML APPLICATOR (WOUND CARE) ×2 IMPLANT
ELECT REM PT RETURN 15FT ADLT (MISCELLANEOUS) ×2 IMPLANT
GLOVE BIO SURGEON STRL SZ7.5 (GLOVE) ×2 IMPLANT
GLOVE BIO SURGEON STRL SZ8.5 (GLOVE) ×2 IMPLANT
GLOVE BIOGEL PI IND STRL 8 (GLOVE) ×1 IMPLANT
GLOVE BIOGEL PI IND STRL 9 (GLOVE) ×1 IMPLANT
GLOVE BIOGEL PI INDICATOR 8 (GLOVE) ×1
GLOVE BIOGEL PI INDICATOR 9 (GLOVE) ×1
GOWN STRL REUS W/TWL XL LVL3 (GOWN DISPOSABLE) ×4 IMPLANT
HANDPIECE INTERPULSE COAX TIP (DISPOSABLE) ×1
HOOD PEEL AWAY FLYTE STAYCOOL (MISCELLANEOUS) ×6 IMPLANT
KIT TURNOVER KIT A (KITS) IMPLANT
NEEDLE HYPO 21X1.5 SAFETY (NEEDLE) ×4 IMPLANT
NS IRRIG 1000ML POUR BTL (IV SOLUTION) ×2 IMPLANT
PACK ICE MAXI GEL EZY WRAP (MISCELLANEOUS) ×2 IMPLANT
PACK TOTAL KNEE CUSTOM (KITS) ×2 IMPLANT
PIN DRILL FIX HALF THREAD (BIT) ×2 IMPLANT
PIN STEINMAN FIXATION KNEE (PIN) ×2 IMPLANT
PROTECTOR NERVE ULNAR (MISCELLANEOUS) ×2 IMPLANT
SET HNDPC FAN SPRY TIP SCT (DISPOSABLE) ×1 IMPLANT
SUT VIC AB 1 CTX 36 (SUTURE) ×1
SUT VIC AB 1 CTX36XBRD ANBCTR (SUTURE) ×1 IMPLANT
SUT VIC AB 3-0 CT1 27 (SUTURE) ×3
SUT VIC AB 3-0 CT1 TAPERPNT 27 (SUTURE) ×3 IMPLANT
SYR CONTROL 10ML LL (SYRINGE) ×4 IMPLANT
TIBIAL BASE ATTUNE KNEE SZ9 (Knees) ×2 IMPLANT
TRAY FOLEY MTR SLVR 16FR STAT (SET/KITS/TRAYS/PACK) ×2 IMPLANT
WATER STERILE IRR 1000ML POUR (IV SOLUTION) ×4 IMPLANT
WRAP KNEE MAXI GEL POST OP (GAUZE/BANDAGES/DRESSINGS) ×2 IMPLANT
YANKAUER SUCT BULB TIP 10FT TU (MISCELLANEOUS) ×2 IMPLANT

## 2019-05-07 NOTE — Discharge Instructions (Signed)

## 2019-05-07 NOTE — Anesthesia Procedure Notes (Signed)
Spinal  Patient location during procedure: OR Start time: 05/07/2019 11:31 AM End time: 05/07/2019 11:37 AM Staffing Anesthesiologist: Lidia Collum, MD Performed: anesthesiologist  Preanesthetic Checklist Completed: patient identified, site marked, surgical consent, pre-op evaluation, timeout performed, IV checked, risks and benefits discussed and monitors and equipment checked Spinal Block Patient position: sitting Prep: Betadine Patient monitoring: heart rate, continuous pulse ox and blood pressure Injection technique: single-shot Needle Needle type: Sprotte and Pencan  Needle gauge: 25 G Needle length: 9 cm Needle insertion depth: 5 cm Assessment Sensory level: T10 Additional Notes Expiration date of kit checked and confirmed. Patient tolerated procedure well, without complications.
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
## 2019-05-07 NOTE — Anesthesia Procedure Notes (Signed)
Anesthesia Regional Block: Adductor canal block   Pre-Anesthetic Checklist: ,, timeout performed, Correct Patient, Correct Site, Correct Laterality, Correct Procedure, Correct Position, site marked, Risks and benefits discussed,  Surgical consent,  Pre-op evaluation,  At surgeon's request and post-op pain management  Laterality: Right  Prep: chloraprep       Needles:  Injection technique: Single-shot  Needle Type: Echogenic Stimulator Needle     Needle Length: 10cm  Needle Gauge: 21     Additional Needles:   Procedures:,,,, ultrasound used (permanent image in chart),,,,  Narrative:  Start time: 05/07/2019 10:25 AM End time: 05/07/2019 10:31 AM Injection made incrementally with aspirations every 5 mL.  Performed by: Personally  Anesthesiologist: Lidia Collum, MD  Additional Notes: Monitors applied. Injection made in 5cc increments. No resistance to injection. Good needle visualization. Patient tolerated procedure well.

## 2019-05-07 NOTE — Progress Notes (Signed)
AssistedDr. Carolyn Witman with right, ultrasound guided, adductor canal block. Side rails up, monitors on throughout procedure. See vital signs in flow sheet. Tolerated Procedure well.  
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
## 2019-05-07 NOTE — Op Note (Signed)
PATIENT ID:      Jesse Ruiz  MRN:     [id number] DOB/AGE:    1966/07/12 / 53 y.o.       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   Weight as of 04/30/19: 122.2 kg.                                                       POSTOPERATIVE DIAGNOSIS:   Same                   PROCEDURE:  Procedure(s): RIGHT TOTAL KNEE ARTHROPLASTY Using DepuyAttune RP implants #8R Femur, #9Tibia, 5 mm Attune RP bearing, 41 Patella    SURGEON: Nestor Lewandowsky  ASSISTANT:   Tomi Likens. 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 Patient has failed all conservative measures including anti-inflammatory medicines, narcotics, attempts at exercise and weight loss, cortisone injections and viscosupplementation.  Risks and benefits of surgery have been discussed, questions answered.   DESCRIPTION OF PROCEDURE: The patient identified by armband, received  IV antibiotics, in the holding area at [facility]. Patient taken to the operating room, appropriate anesthetic monitors were attached, and Spinal anesthesia was  induced. IV Tranexamic acid was given.Tourniquet applied high to the operative thigh. Lateral post and foot positioner applied to the table, the lower extremity was then prepped and draped in usual sterile fashion from the toes to  the tourniquet. Time-out procedure was performed. The skin and subcutaneous tissue along the incision was injected with 20 cc of a mixture of Exparel and Marcaine solution, using a 20-gauge by 1-1/2 inch needle. We began the operation, with the knee flexed 130 degrees, by making the anterior midline incision starting at handbreadth above the patella going over the patella 1 cm medial to and 4 cm distal to the tibial tubercle. Small bleeders in the skin and the subcutaneous tissue identified and cauterized. Transverse retinaculum was incised and reflected medially and a medial parapatellar arthrotomy was accomplished. the patella was everted and theprepatellar fat pad resected. The superficial medial collateral ligament was then elevated from anterior to posterior along the proximal flare of the tibia and anterior half of the menisci resected. The knee was hyperflexed exposing bone on bone arthritis. Peripheral and notch osteophytes as well as the cruciate ligaments were then resected. We continued to work our way around posteriorly along the proximal tibia, and externally rotated the tibia subluxing it out from underneath the femur. A McHale PCL retractor was placed through the notch and a lateral Hohmann retractor placed, and we then entered the proximal tibia in line with  the Depuy starter drill in line with the axis of the tibia followed by an intramedullary guide rod and 0-degree posterior slope cutting guide. The tibial cutting guide, 4 degree posterior sloped, was pinned into place allowing resection of 5 mm of bone medially and 5 mm of bone laterally. Satisfied with the tibial resection, we then entered the distal femur 2 mm anterior to the PCL origin with the intramedullary guide rod and applied the distal femoral cutting guide set at 9 mm, with 5 degrees of valgus. This was pinned along the epicondylar axis. At this point, the distal femoral cut was accomplished without difficulty. We then sized for a #8R  femoral component and pinned the guide in 3 degrees of external rotation. The chamfer cutting guide was pinned into place. The anterior, posterior, and chamfer cuts were accomplished without difficulty followed by the Attune RP box cutting guide and the box cut. We also removed posterior osteophytes from the posterior femoral condyles. The posterior capsule was injected with Exparel solution. The knee was brought into full extension. We checked our extension gap and fit a 5 mm bearing. Distracting in extension with a lamina spreader,  bleeders in the posterior capsule, Posterior medial and posterior lateral gutter were cauterized.  The transexamic acid-soaked sponge was then placed in the gap of the knee in extension. The knee was flexed 30. The posterior patella cut was accomplished with the 9.5 mm Attune cutting guide, sized for a 21mm dome, and the fixation pegs drilled.The knee was then once again hyperflexed exposing the proximal tibia. We sized for a # 5 tibial base plate, applied the smokestack and the conical reamer followed by the the Delta fin keel punch. We then hammered into place the Attune RP trial femoral component, drilled the lugs, inserted a  5 mm trial bearing, trial patellar button, and took the knee through range of motion from 0-130 degrees. Medial and lateral ligamentous stability was checked. No thumb pressure was required for patellar Tracking. The tourniquet was not used. All trial components were removed, mating surfaces irrigated with pulse lavage, and dried with suction and sponges. 10 cc of the Exparel solution was applied to the cancellus bone of the patella distal femur and proximal tibia.  After waiting 30 seconds, the bony surfaces were again, dried with sponges. A double batch of DePuy HV cement was mixed and applied to all bony metallic mating surfaces except for the posterior condyles of the femur itself. In order, we hammered into place the tibial tray and removed excess  cement, the femoral component and removed excess cement. The final Attune RP bearing was inserted, and the knee brought to full extension with compression. The patellar button was clamped into place, and excess cement removed. The knee was held at 30 flexion with compression, while the cement cured. The wound was irrigated out with normal saline solution pulse lavage. The rest of the Exparel was injected into the parapatellar arthrotomy, subcutaneous tissues, and periosteal tissues. The parapatellar arthrotomy was closed with running #1 Vicryl suture. The subcutaneous tissue with 0 and 2-0 undyed Vicryl suture, and the skin with running 3-0 SQ vicryl. An Aquacil and Ace wrap were applied. The patient was taken to recovery room without difficulty.   Kerin Salen 05/07/2019, 7:13 AM

## 2019-05-07 NOTE — Anesthesia Procedure Notes (Signed)
Spinal  Patient location during procedure: OR Staffing Anesthesiologist: Caelie Remsburg E, MD Performed: anesthesiologist  Preanesthetic Checklist Completed: patient identified, surgical consent, pre-op evaluation, timeout performed, IV checked, risks and benefits discussed and monitors and equipment checked Spinal Block Patient position: sitting Prep: site prepped and draped and DuraPrep Patient monitoring: continuous pulse ox, blood pressure and heart rate Approach: midline Location: L3-4 Injection technique: single-shot Needle Needle type: Pencan  Needle gauge: 24 G Needle length: 9 cm Additional Notes Functioning IV was confirmed and monitors were applied. Sterile prep and drape, including hand hygiene and sterile gloves were used. The patient was positioned and the spine was prepped. The skin was anesthetized with lidocaine.  Free flow of clear CSF was obtained prior to injecting local anesthetic into the CSF. The needle was carefully withdrawn. The patient tolerated the procedure well.      

## 2019-05-07 NOTE — Transfer of Care (Signed)
Immediate Anesthesia Transfer of Care Note  Patient: Jesse Ruiz  Procedure(s) Performed: RIGHT TOTAL KNEE ARTHROPLASTY (Right Knee)  Patient Location: PACU  Anesthesia Type:Spinal  Level of Consciousness: awake, alert , oriented and patient cooperative  Airway & Oxygen Therapy: Patient Spontanous Breathing and Patient connected to face mask oxygen  Post-op Assessment: Report given to RN and Post -op Vital signs reviewed and stable  Post vital signs: Reviewed and stable  Last Vitals:  Vitals Value Taken Time  BP 102/60 05/07/19 1331  Temp    Pulse 67 05/07/19 1333  Resp 10 05/07/19 1333  SpO2 100 % 05/07/19 1333  Vitals shown include unvalidated device data.  Last Pain:  Vitals:   05/07/19 1035  TempSrc:   PainSc: 0-No pain      Patients Stated Pain Goal: 4 (25/85/27 7824)  Complications: No apparent anesthesia complications

## 2019-05-07 NOTE — Interval H&P Note (Signed)
History and Physical Interval Note:  05/07/2019 9:40 AM  Jesse Ruiz  has presented today for surgery, with the diagnosis of [place].  The various methods of treatment have been discussed with the patient and family. After consideration of risks, benefits and other options for treatment, the patient has consented to  Procedure(s): RIGHT TOTAL KNEE ARTHROPLASTY (Right) as a surgical intervention.  The patient's history has been reviewed, patient examined, no change in status, stable for surgery.  I have reviewed the patient's chart and labs.  Questions were answered to the patient's satisfaction.     Kerin Salen

## 2019-05-08 ENCOUNTER — Encounter (HOSPITAL_COMMUNITY): Payer: Self-pay | Admitting: Orthopedic Surgery

## 2019-05-08 DIAGNOSIS — M879 Osteonecrosis, unspecified: Secondary | ICD-10-CM | POA: Diagnosis not present

## 2019-05-08 LAB — BASIC METABOLIC PANEL
Anion gap: 10 (ref 5–15)
BUN: 15 mg/dL (ref 6–20)
CO2: 21 mmol/L — ABNORMAL LOW (ref 22–32)
Calcium: 8.7 mg/dL — ABNORMAL LOW (ref 8.9–10.3)
Chloride: 98 mmol/L (ref 98–111)
Creatinine, Ser: 0.81 mg/dL (ref 0.61–1.24)
GFR calc Af Amer: >60 mL/min (ref >60)
GFR calc non Af Amer: >60 mL/min (ref >60)
Glucose, Bld: 189 mg/dL — ABNORMAL HIGH (ref 70–99)
Potassium: 4.6 mmol/L (ref 3.5–5.1)
Sodium: 129 mmol/L — ABNORMAL LOW (ref 135–145)

## 2019-05-08 LAB — CBC
HCT: 43.1 % (ref 39.0–52.0)
Hemoglobin: 14.5 g/dL (ref 13.0–17.0)
MCH: 31.4 pg (ref 26.0–34.0)
MCHC: 33.6 g/dL (ref 30.0–36.0)
MCV: 93.3 fL (ref 80.0–100.0)
Platelets: 195 10*3/uL (ref 150–400)
RBC: 4.62 MIL/uL (ref 4.22–5.81)
RDW: 12.2 % (ref 11.5–15.5)
WBC: 17.2 10*3/uL — ABNORMAL HIGH (ref 4.0–10.5)
nRBC: 0 % (ref 0.0–0.2)
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
## 2019-05-08 NOTE — Anesthesia Postprocedure Evaluation (Signed)
Anesthesia Post Note  Patient: Jesse Ruiz  Procedure(s) Performed: RIGHT TOTAL KNEE ARTHROPLASTY (Right Knee)     Patient location during evaluation: PACU Anesthesia Type: Regional Level of consciousness: oriented and awake and alert Pain management: pain level controlled Vital Signs Assessment: post-procedure vital signs reviewed and stable Respiratory status: spontaneous breathing, respiratory function stable, nonlabored ventilation and patient connected to nasal cannula oxygen Cardiovascular status: blood pressure returned to baseline and stable Postop Assessment: no headache, no backache, no apparent nausea or vomiting and spinal receding Anesthetic complications: no    Last Vitals:  Vitals:   05/08/19 0117 05/08/19 0548  BP: (!) 148/84 (!) 157/88  Pulse: 68 66  Resp: 16 16  Temp: 36.6 C 36.9 C  SpO2: 98% 97%    Last Pain:  Vitals:   05/08/19 0548  TempSrc: Oral  PainSc:    Pain Goal: Patients Stated Pain Goal: 4 (05/07/19 1845)                 Lidia Collum

## 2019-05-08 NOTE — Progress Notes (Signed)
PATIENT ID: Jesse Ruiz  MRN: [id number]  DOB/AGE:  01-04-1966 / 53 y.o.  1 Day Post-Op Procedure(s) (LRB): RIGHT TOTAL KNEE ARTHROPLASTY (Right)    PROGRESS NOTE Subjective: Patient is alert, oriented, no Nausea, no Vomiting, yes passing gas. Taking PO well. Denies SOB, Chest or Calf Pain. Using Incentive Spirometer, PAS in place. Ambulate WBAT with pt walking 35 ft, Patient reports pain as moderate.    Objective: Vital signs in last 24 hours: Vitals:   05/07/19 1833 05/07/19 2146 05/08/19 0117 05/08/19 0548  BP: (!) 167/99 (!) 167/101 (!) 148/84 (!) 157/88  Pulse: 86 72 68 66  Resp: 18 16 16 16   Temp: 97.9 F (36.6 C) 97.7 F (36.5 C) 97.8 F (36.6 C) 98.5 F (36.9 C)  TempSrc: Oral Oral Oral Oral  SpO2: 96% 99% 98% 97%  Weight:      Height:          Intake/Output from previous day: I/O last 3 completed shifts: In: 5732 [P.O.:720; I.V.:2996.7; IV Piggyback:72.3] Out: 4200 [Urine:4150; Blood:50]   Intake/Output this shift: No intake/output data recorded.   LABORATORY DATA: Recent Labs    05/08/19 0305  WBC 17.2*  HGB 14.5  HCT 43.1  PLT 195  NA 129*  K 4.6  CL 98  CO2 21*  BUN 15  CREATININE 0.81  GLUCOSE 189*  CALCIUM 8.7*    Examination: Neurologically intact Neurovascular intact Sensation intact distally Intact pulses distally Dorsiflexion/Plantar flexion intact Incision: dressing C/D/I No cellulitis present Compartment soft}  Assessment:   1 Day Post-Op Procedure(s) (LRB): RIGHT TOTAL KNEE ARTHROPLASTY (Right) ADDITIONAL DIAGNOSIS: Expected Acute Blood Loss Anemia, hx of blood clots  Patient's anticipated LOS is less than 2 midnights, meeting these requirements: - Younger than 18 - Lives within 1 hour of care - Has a competent adult at home to recover with post-op recover - NO history of  - Chronic pain requiring opiods  - Diabetes  - Coronary Artery Disease  - Heart failure  - Heart attack  - Stroke  - DVT/VTE  - Cardiac  arrhythmia  - Respiratory Failure/COPD  - Renal failure  - Anemia  - Advanced Liver disease       Plan: PT/OT WBAT, AROM and PROM  DVT Prophylaxis:  [id number], Xarelto DISCHARGE PLAN: Home DISCHARGE NEEDS: HHPT, Walker and 3-in-1 comode seat     Joanell Rising 05/08/2019, 8:40 AM

## 2019-05-08 NOTE — Plan of Care (Signed)
  Problem: Education: Goal: Knowledge of the prescribed therapeutic regimen will improve Outcome: Progressing   Problem: Activity: Goal: Ability to avoid complications of mobility impairment will improve Outcome: Progressing   Problem: Clinical Measurements: Goal: Postoperative complications will be avoided or minimized Outcome: Progressing   Problem: Pain Management: Goal: Pain level will decrease with appropriate interventions Outcome: Progressing   Problem: Education: Goal: Knowledge of General Education information will improve Description: Including pain rating scale, medication(s)/side effects and non-pharmacologic comfort measures Outcome: Progressing   Problem: Coping: Goal: Level of anxiety will decrease Outcome: Progressing   
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
## 2019-05-08 NOTE — Plan of Care (Signed)
Pt ready for D\C

## 2019-05-22 DIAGNOSIS — M25561 Pain in right knee: Secondary | ICD-10-CM | POA: Diagnosis not present

## 2019-06-05 ENCOUNTER — Ambulatory Visit (HOSPITAL_COMMUNITY)
Admission: RE | Admit: 2019-06-05 | Discharge: 2019-06-05 | Disposition: A | Discharge status: Home / Self Care | Payer: BC Managed Care – PPO | Source: Ambulatory Visit | Attending: Orthopedic Surgery | Admitting: Orthopedic Surgery

## 2019-06-05 ENCOUNTER — Other Ambulatory Visit (HOSPITAL_COMMUNITY): Payer: Self-pay | Admitting: Orthopedic Surgery

## 2019-06-05 ENCOUNTER — Other Ambulatory Visit: Payer: Self-pay

## 2019-06-05 DIAGNOSIS — M7989 Other specified soft tissue disorders: Secondary | ICD-10-CM | POA: Insufficient documentation

## 2019-06-05 DIAGNOSIS — M79604 Pain in right leg: Secondary | ICD-10-CM | POA: Insufficient documentation

## 2019-06-05 NOTE — Progress Notes (Signed)
Right lower extremity venous duplex has been completed. Preliminary results can be found in CV Proc through chart review.  Results were given to Randall Hiss at Dr. Damita Dunnings office.   06/05/19 11:26 AM Jesse Ruiz RVT

## 2019-06-12 ENCOUNTER — Telehealth: Payer: Self-pay | Admitting: Oncology

## 2019-06-12 NOTE — Telephone Encounter (Signed)
Called patient regarding providers request, patient agreed to do virtual visit.

## 2019-06-19 ENCOUNTER — Inpatient Hospital Stay: Payer: BC Managed Care – PPO | Attending: Oncology | Admitting: Oncology

## 2019-06-19 ENCOUNTER — Ambulatory Visit: Payer: Self-pay | Admitting: Oncology

## 2019-06-19 DIAGNOSIS — O2230 Deep phlebothrombosis in pregnancy, unspecified trimester: Secondary | ICD-10-CM

## 2019-06-19 DIAGNOSIS — Z96653 Presence of artificial knee joint, bilateral: Secondary | ICD-10-CM | POA: Diagnosis not present

## 2019-06-19 DIAGNOSIS — Z86718 Personal history of other venous thrombosis and embolism: Secondary | ICD-10-CM | POA: Diagnosis not present

## 2019-06-19 DIAGNOSIS — Z7901 Long term (current) use of anticoagulants: Secondary | ICD-10-CM

## 2019-06-19 NOTE — Progress Notes (Signed)
Hematology and Oncology Follow Up for Telemedicine Visits  Jesse Ruiz [id number] Mar 12, 1966 53 y.o. 06/19/2019 9:24 AM Patient, No Pcp PerNo ref. provider found   I connected with Jesse Ruiz on 06/19/19 at  9:30 AM EST by video enabled telemedicine visit and verified that I am speaking with the correct person using two identifiers.   I discussed the limitations, risks, security and privacy concerns of performing an evaluation and management service by telemedicine and the availability of in-person appointments. I also discussed with the patient that there may be a patient responsible charge related to this service. The patient expressed understanding and agreed to proceed.  Other persons participating in the visit and their role in the encounter:  None   Patient's location: Office Provider's location: Home    Principle Diagnosis: 53 year old with left lower extremity deep vein thrombosis diagnosed in June 2020.  This happened in the setting of a left knee replacement and immobilization.   Current therapy: Xarelto 20 mg daily.  Interim History: Jesse Ruiz reports feeling well at this time and has recently recovered from right total knee arthroplasty completed on May 07, 2019.  He is recovering reasonably well at this time and has not had any complications.  He continues to be on Xarelto and has not reported any thrombosis or bleeding episodes since that time.  He is ambulatory at this time but does report some right knee pain.  He denies any hematochezia or melena.  He denies any recent hospitalizations after his surgery.     Medications: I have reviewed the patient's current medications.  Current Outpatient Medications  Medication Sig Dispense Refill  . albuterol (VENTOLIN HFA) 108 (90 Base) MCG/ACT inhaler Inhale into the lungs every 6 (six) hours as needed for wheezing or shortness of breath. For sinus infection    . amoxicillin (AMOXIL) 500 MG tablet Take 500 mg by mouth 2 (two)  times daily. X 7 days for sinus infection    . HYDROmorphone (DILAUDID) 4 MG tablet Take 0.5-1 tablets (2-4 mg total) by mouth every 4 (four) hours as needed for severe pain. 30 tablet 0  . lisinopril (ZESTRIL) 5 MG tablet Take 5 mg by mouth daily.    . methocarbamol (ROBAXIN) 500 MG tablet Take 1 tablet (500 mg total) by mouth 2 (two) times daily with a meal. 60 tablet 0  . Multiple Vitamins-Minerals (MULTIVITAMIN MEN) TABS Take 1 tablet by mouth daily.    . rivaroxaban (XARELTO) 20 MG TABS tablet Take 1 tablet (20 mg total) by mouth daily with supper. 30 tablet 5  . traMADol (ULTRAM) 50 MG tablet Take 50 mg by mouth every 6 (six) hours as needed for moderate pain.     No current facility-administered medications for this visit.      Allergies:  Allergies  Allergen Reactions  . Oxycodone Nausea Only    "shaking"    Past Medical History, Surgical history, Social history, and Family History were reviewed and updated.     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 There is no evidence of deep vein thrombosis in the lower extremity. No cystic structure found in the popliteal fossa. Left: No evidence of common femoral vein obstruction.  Impression and Plan:  53 year old man with:  1.    Left lower extremity DVT diagnosed in June 2020.  This was noted in the setting of an orthopedic surgery and in localization.  He is currently on  Xarelto and has been on it since June 2020 without any major complications.  He also underwent right knee arthroplasty without any postoperative complications.  Risks and benefits of continuing Xarelto versus discontinuation was reviewed at this time.  Given his recent thrombosis was clearly provoked, I recommended discontinuation of anticoagulation to complete 6 months of therapy.  And given his recent surgery about 6 weeks ago I recommended Xarelto to begin continued through the month of December and stop in January 2021.  He understands if he develops any unprovoked thrombosis or recurrent thrombosis in the future on lifetime anticoagulation will be required.  He did have a repeat ultrasound which showed chronic superficial thrombosis of the right and no deep vein thrombosis of the left.  He understands this and agreeable with this plan.  2.  Follow-up: I am happy to see him in the future as needed.   I discussed the assessment and treatment plan with the patient. The patient was provided an opportunity to ask questions and all were answered. The patient agreed with the plan and demonstrated an understanding of the instructions.   The patient was advised to call back or seek an in-person evaluation if the symptoms worsen or if the condition fails to improve as anticipated.  I provided 15 minutes of face-to-face video visit time during this encounter, and > 50% was dedicated to reviewing imaging studies, discussing treatment options and future plan of care.  Eli Hose, MD 06/19/2019 9:24 AM

## 2019-06-20 ENCOUNTER — Telehealth: Payer: Self-pay | Admitting: Oncology

## 2019-06-20 NOTE — Telephone Encounter (Signed)
No los per 12/1. °

## 2019-08-07 DIAGNOSIS — M25562 Pain in left knee: Secondary | ICD-10-CM | POA: Diagnosis not present

## 2019-08-07 DIAGNOSIS — Z96652 Presence of left artificial knee joint: Secondary | ICD-10-CM | POA: Diagnosis not present

## 2019-09-17 DIAGNOSIS — R200 Anesthesia of skin: Secondary | ICD-10-CM | POA: Diagnosis not present

## 2019-09-17 DIAGNOSIS — M4186 Other forms of scoliosis, lumbar region: Secondary | ICD-10-CM | POA: Diagnosis not present

## 2019-09-17 DIAGNOSIS — M5136 Other intervertebral disc degeneration, lumbar region: Secondary | ICD-10-CM | POA: Diagnosis not present

## 2019-09-17 DIAGNOSIS — G5710 Meralgia paresthetica, unspecified lower limb: Secondary | ICD-10-CM | POA: Diagnosis not present

## 2019-09-17 DIAGNOSIS — R319 Hematuria, unspecified: Secondary | ICD-10-CM | POA: Diagnosis not present

## 2019-09-17 DIAGNOSIS — R300 Dysuria: Secondary | ICD-10-CM | POA: Diagnosis not present

## 2019-09-17 DIAGNOSIS — N390 Urinary tract infection, site not specified: Secondary | ICD-10-CM | POA: Diagnosis not present

## 2019-11-04 DIAGNOSIS — H5213 Myopia, bilateral: Secondary | ICD-10-CM | POA: Diagnosis not present

## 2019-11-19 DIAGNOSIS — M546 Pain in thoracic spine: Secondary | ICD-10-CM | POA: Diagnosis not present

## 2019-12-14 DIAGNOSIS — Z23 Encounter for immunization: Secondary | ICD-10-CM | POA: Diagnosis not present

## 2020-01-08 DIAGNOSIS — M25561 Pain in right knee: Secondary | ICD-10-CM | POA: Diagnosis not present

## 2020-01-08 DIAGNOSIS — Z96653 Presence of artificial knee joint, bilateral: Secondary | ICD-10-CM | POA: Diagnosis not present

## 2020-01-08 DIAGNOSIS — Z96651 Presence of right artificial knee joint: Secondary | ICD-10-CM | POA: Diagnosis not present

## 2020-01-08 DIAGNOSIS — Z471 Aftercare following joint replacement surgery: Secondary | ICD-10-CM | POA: Diagnosis not present

## 2020-01-16 DIAGNOSIS — R1032 Left lower quadrant pain: Secondary | ICD-10-CM | POA: Diagnosis not present

## 2020-01-16 DIAGNOSIS — R109 Unspecified abdominal pain: Secondary | ICD-10-CM | POA: Diagnosis not present

## 2020-03-26 IMAGING — CR DG ORBITS FOR FOREIGN BODY
2 series · 2 of 2 positions shown · non-contrast
Comparison: None.

CLINICAL DATA: Metal working/exposure; clearance prior to MRI

EXAM:
ORBITS FOR FOREIGN BODY - 2 VIEW

[orbits waters (1 of 2)]
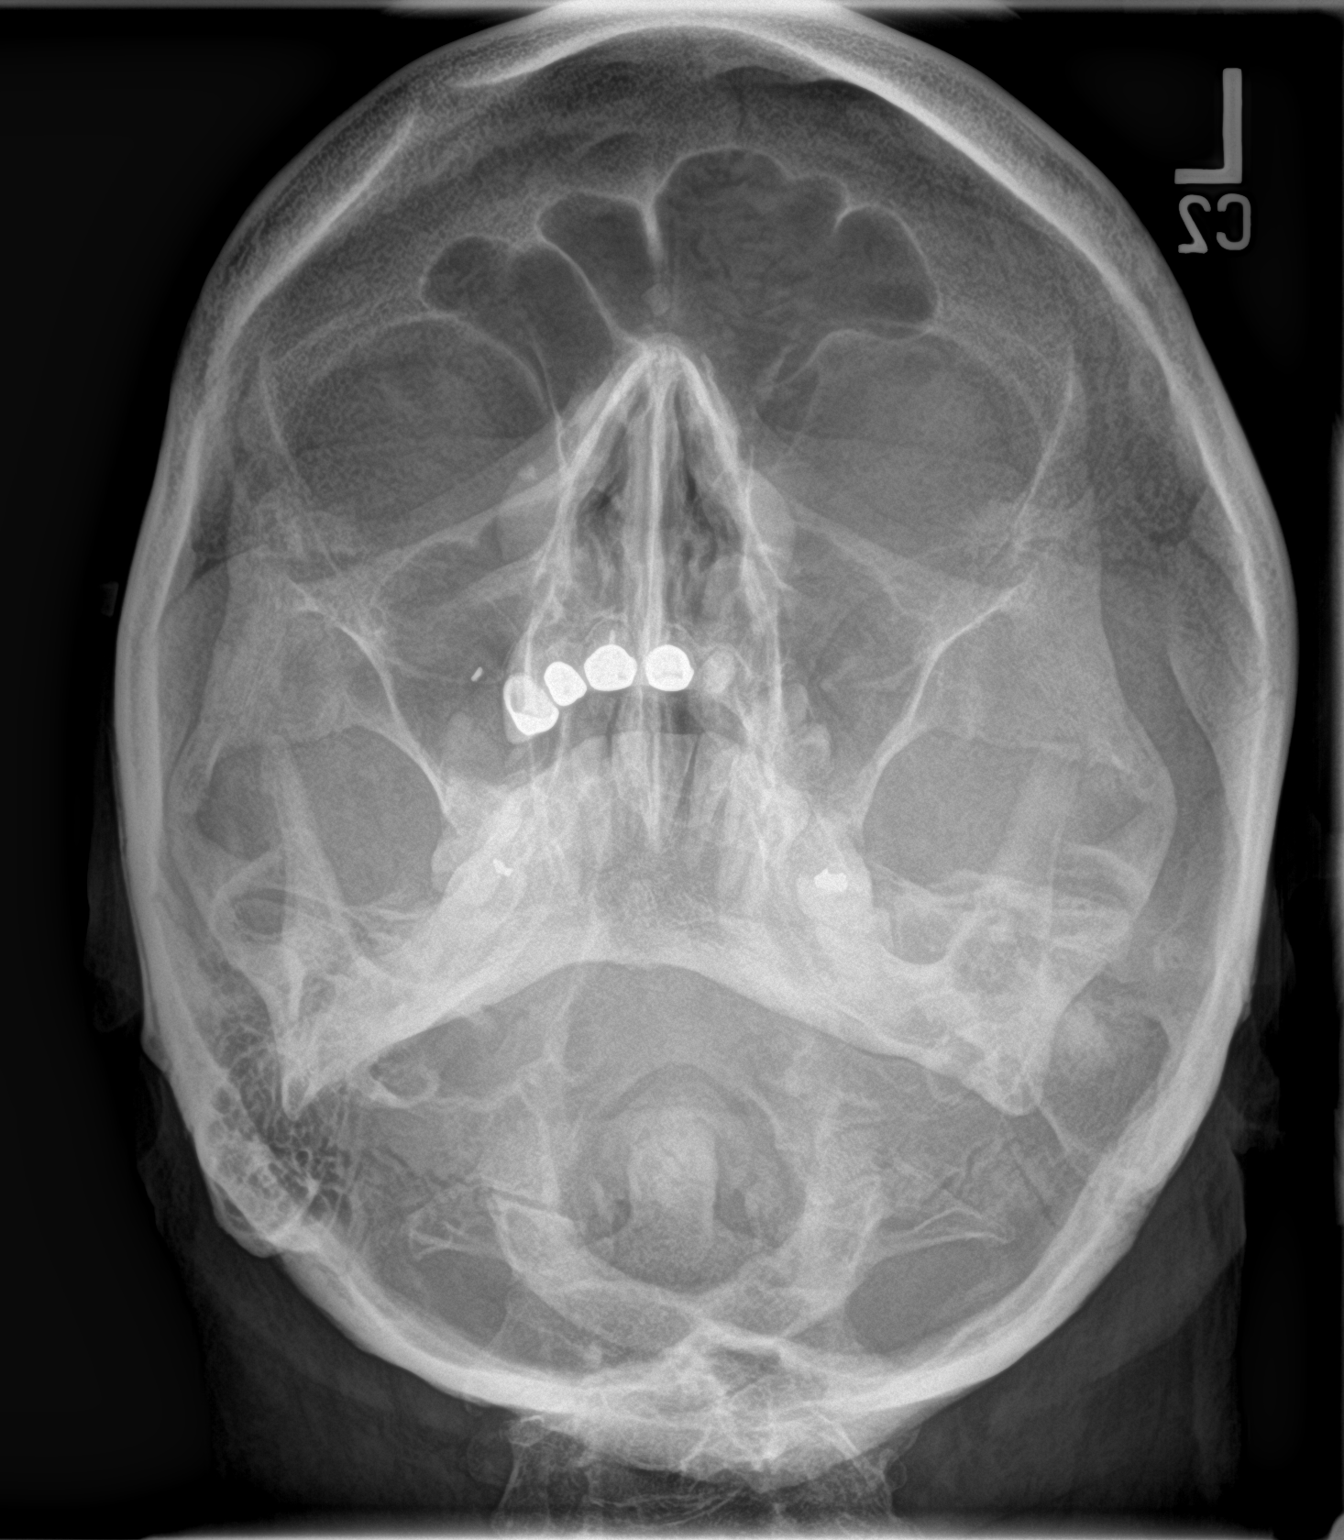

[orbits waters (2 of 2)]
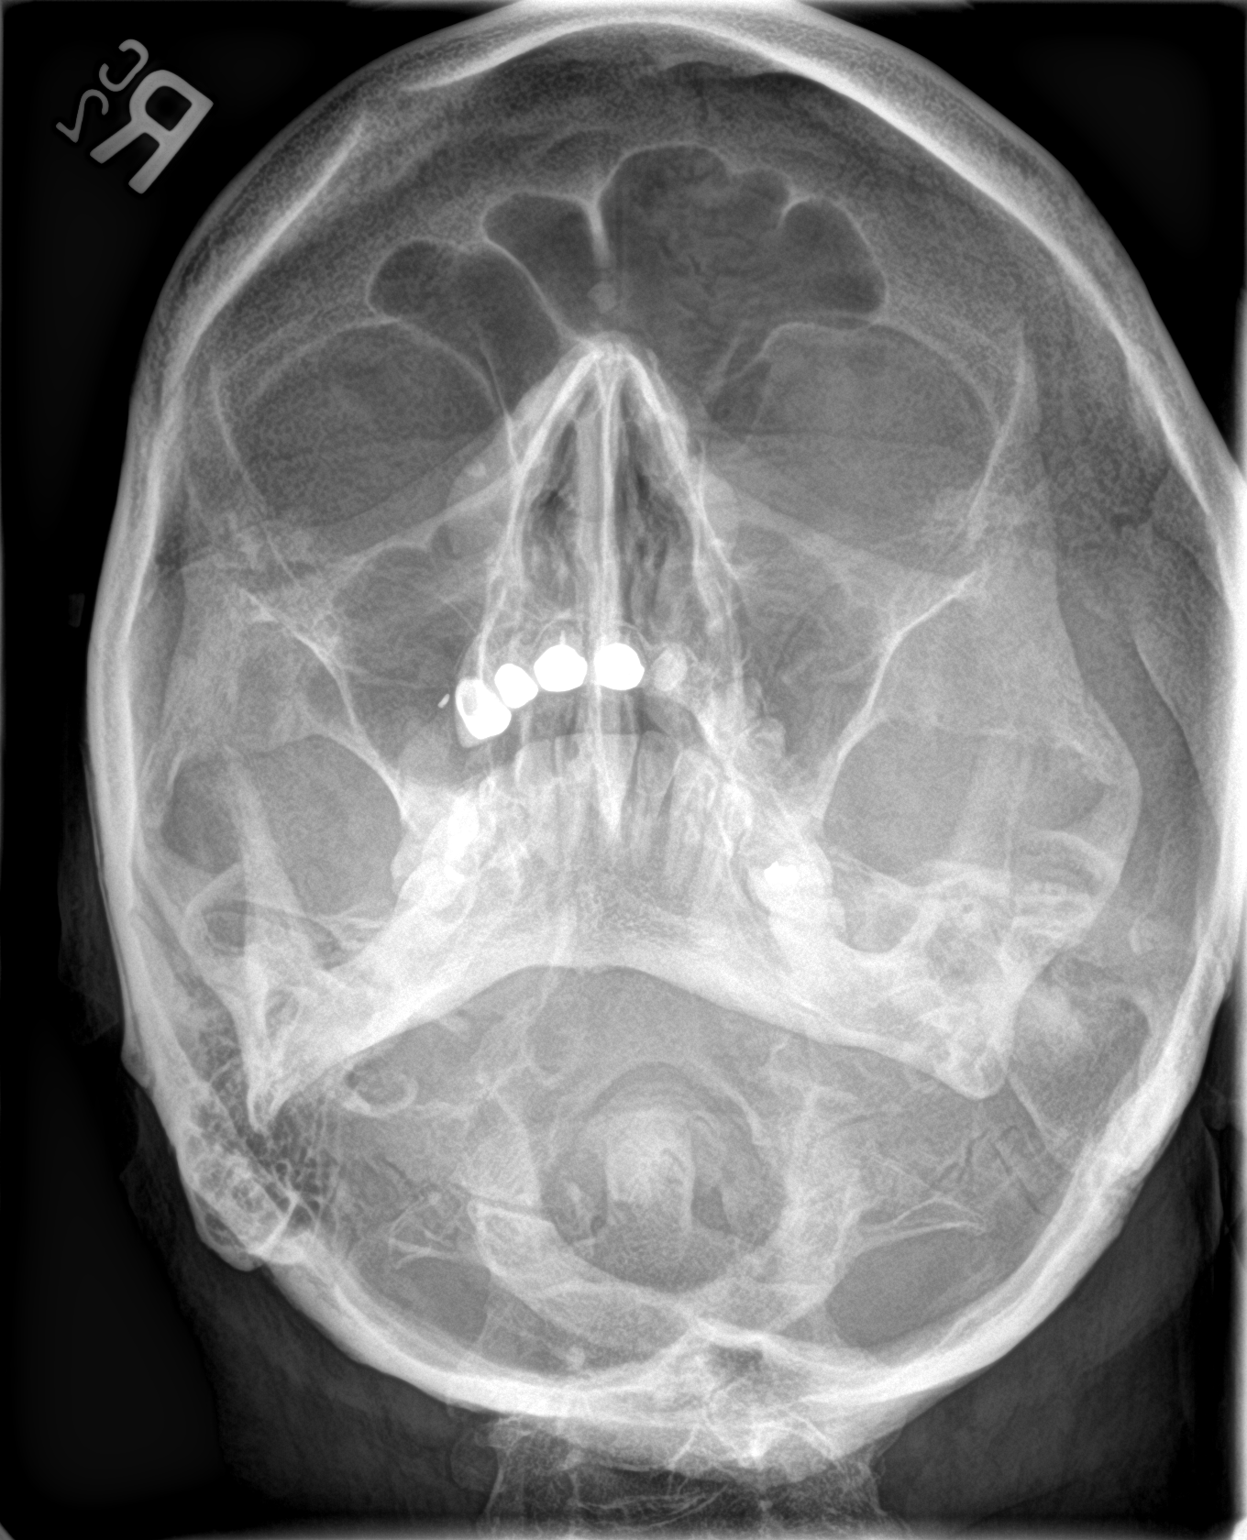

[2 of 2 positions shown; findings below may reference images not displayed]

FINDINGS: Water's views with eyes deviated toward the left and toward the
right were obtained. There is a nonmetallic radiopaque foreign body
in the medial right orbit inferiorly which by report represents a
small glass fragment. No metallic foreign body evident in either
orbit. There is a small metallic fragment in the right maxillary
antrum, inferior to the right orbit.

There is a small osteoma in the right frontal sinus. Paranasal
sinuses elsewhere clear. No fracture or dislocation.
IMPRESSION: No evidence of metallic foreign body within the orbits. Probable
glass fragment within the inferomedial right orbit. Small metallic
foreign body noted inferior to the right orbit in the right
maxillary antrum.

## 2020-03-26 IMAGING — MR MR KNEE*R* W/O CM
4 of 6 series · 19 of 40 positions shown · non-contrast
Comparison: None.

CLINICAL DATA: Right knee pain.  No recent trauma.

EXAM:
MRI OF THE RIGHT KNEE WITHOUT CONTRAST
TECHNIQUE: Multiplanar, multisequence MR imaging of the knee was performed. No
intravenous contrast was administered.

[Series 2: T2 fat-sat · axial · 4.0mm · 0.31mm/px · z∈[-45,+45]mm · 3 of 23 slices shown (1 of 2)]
[im 5/23]
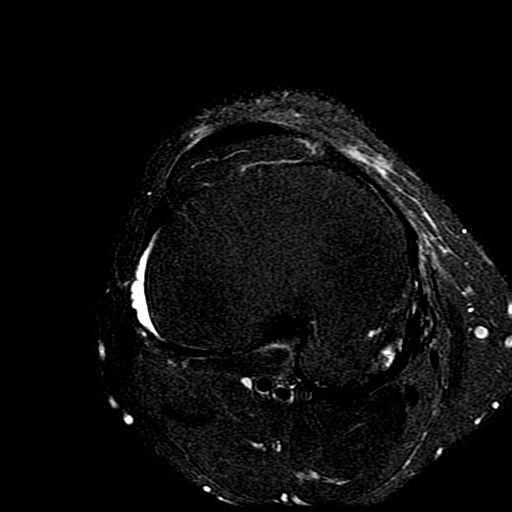
[im 14/23]
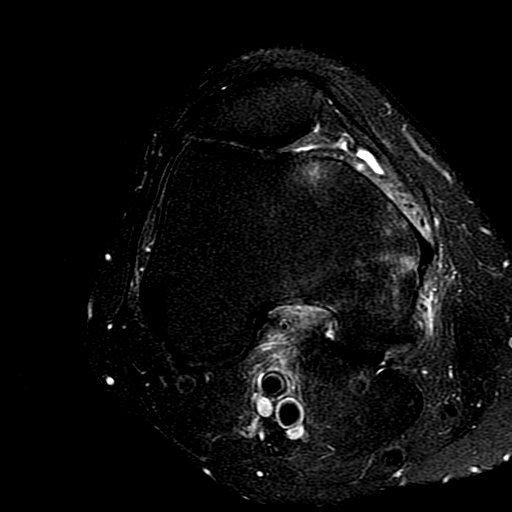
[im 23/23]
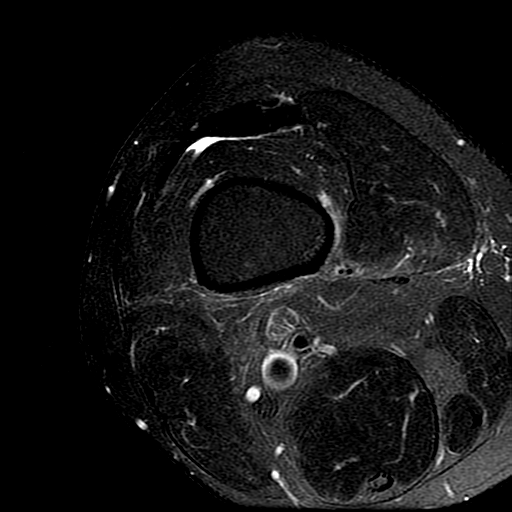

[Series 4: T2 fat-sat · coronal · 4.0mm · 0.29mm/px · 3 of 24 slices shown (2 of 2)]
[im 5/24]
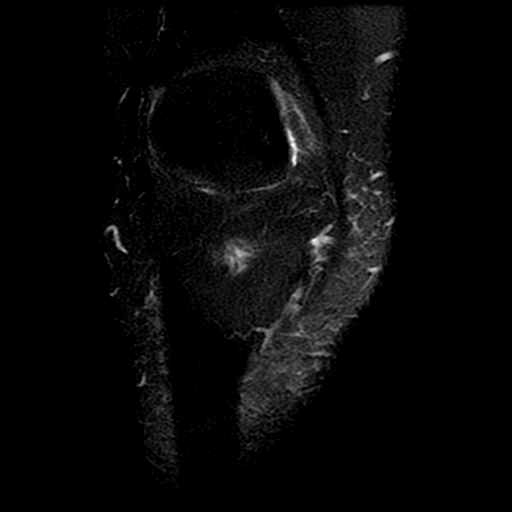
[im 14/24]
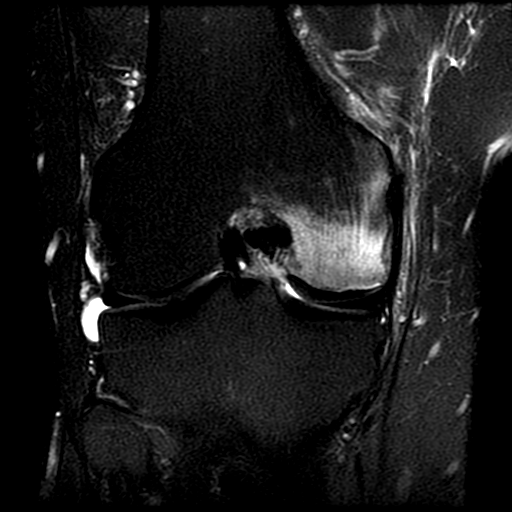
[im 24/24]
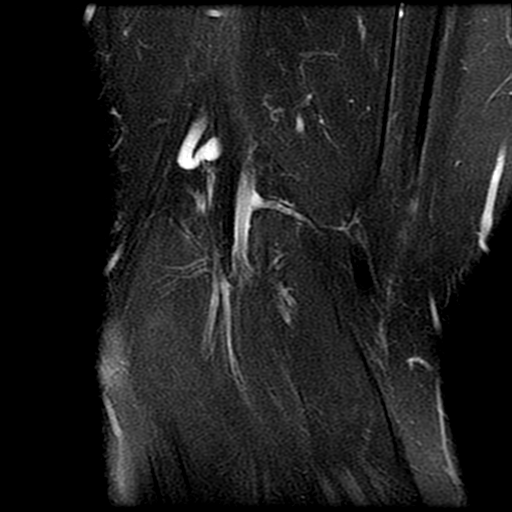

[Series 5: PD fat-sat · coronal · 4.0mm · 0.29mm/px · 6 of 24 slices shown (1 of 2)]
[im 1/24]
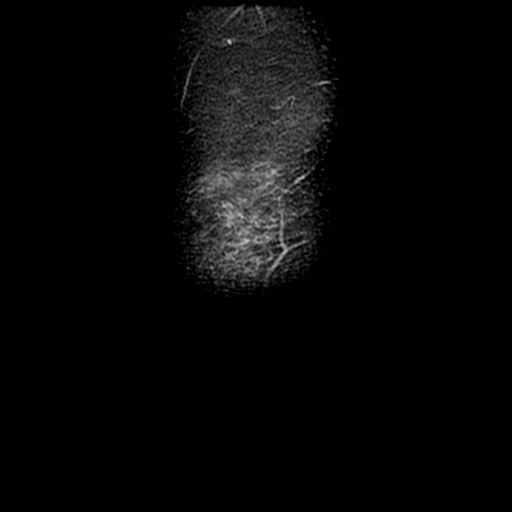
[im 5/24]
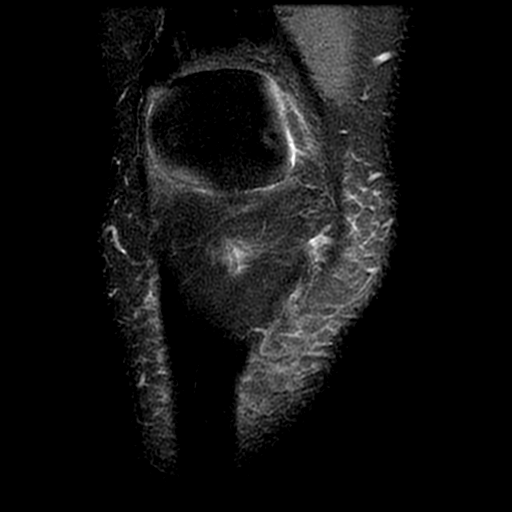
[im 10/24]
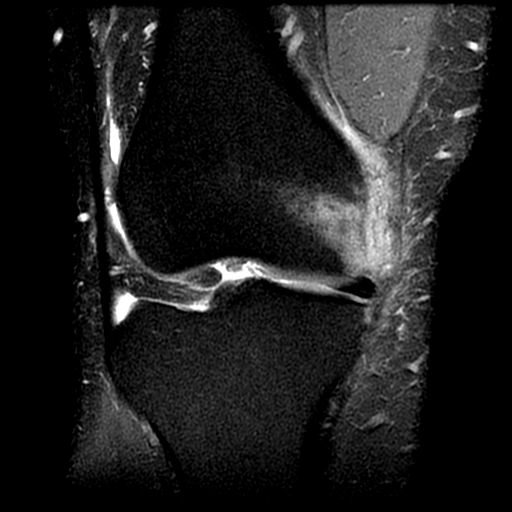
[im 14/24]
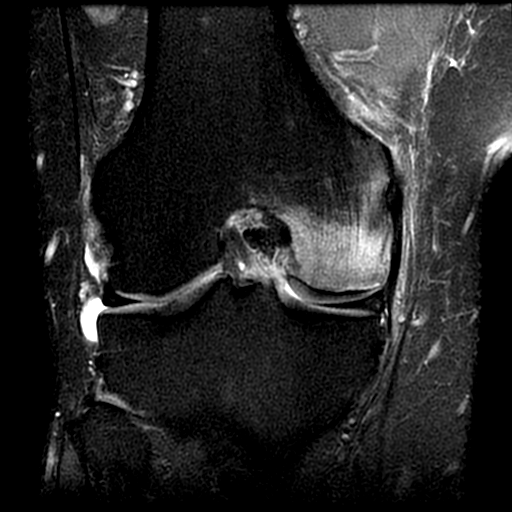
[im 19/24]
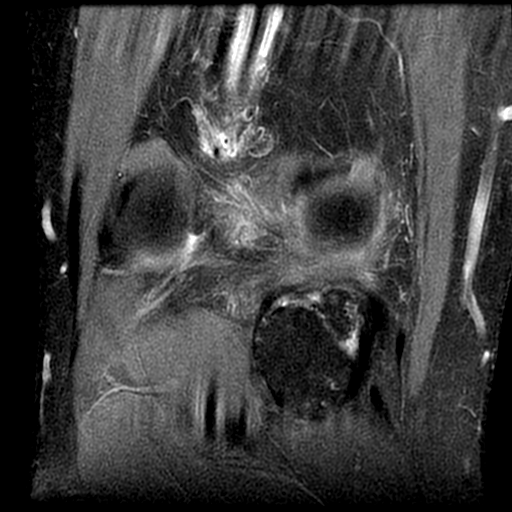
[im 24/24]
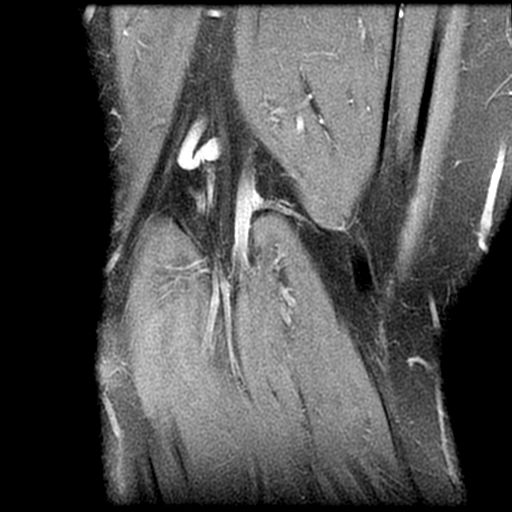

[Series 6: PD fat-sat · sagittal · 3.0mm · 0.29mm/px · 7 of 31 slices shown (2 of 2)]
[im 1/31]
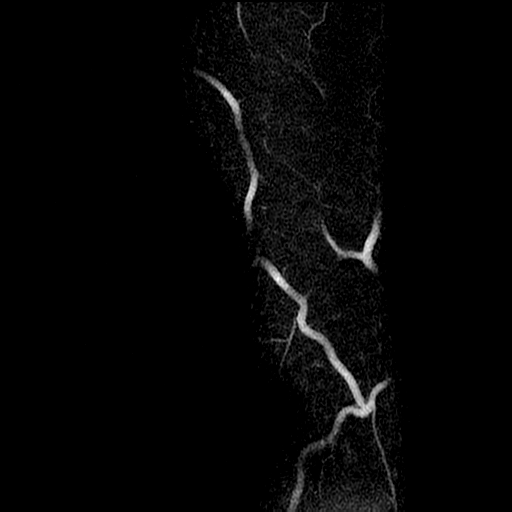
[im 5/31]
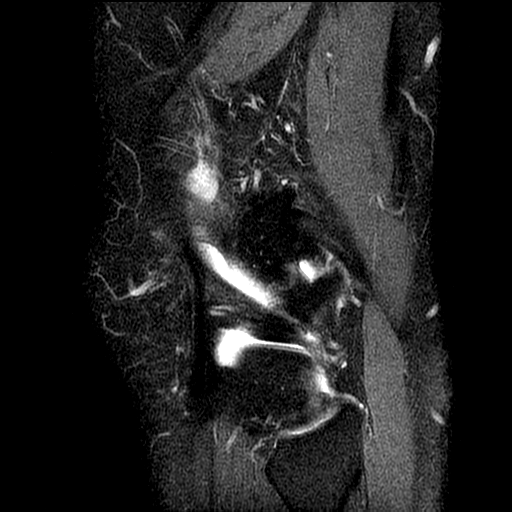
[im 9/31]
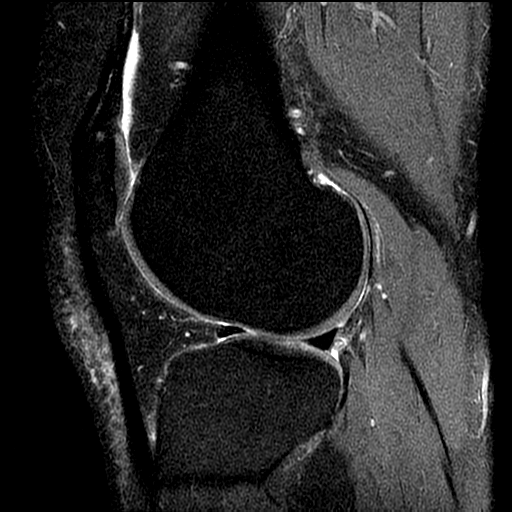
[im 13/31]
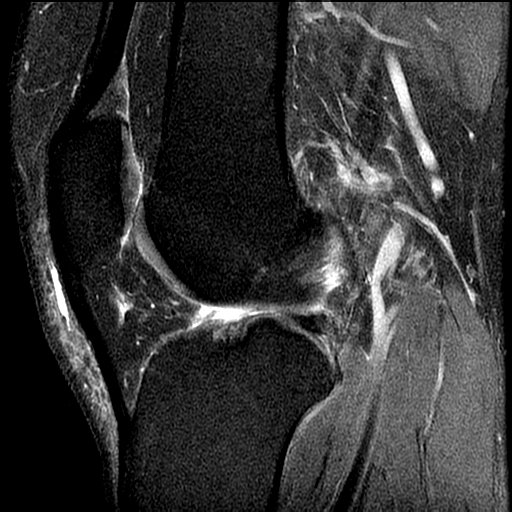
[im 18/31]
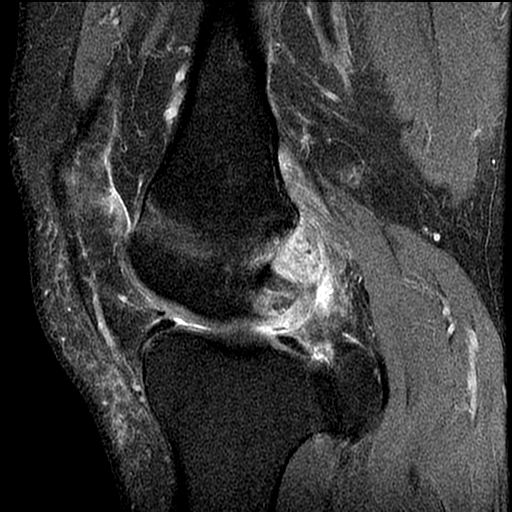
[im 22/31]
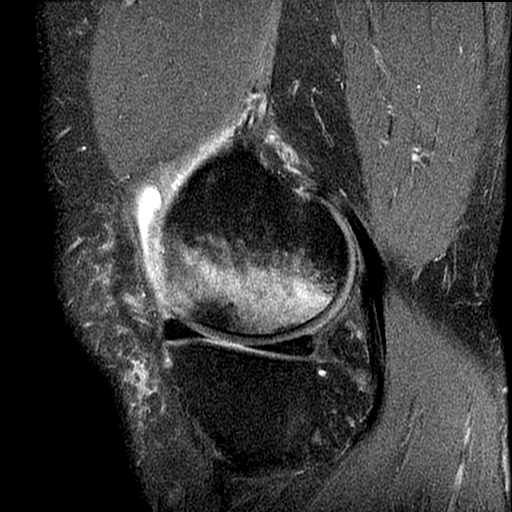
[im 26/31]
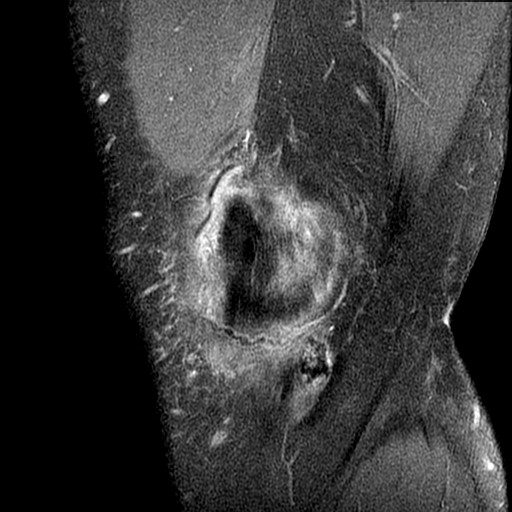

[19 of 40 positions shown; findings below may reference images not displayed]

FINDINGS: MENISCI

Medial meniscus:  Intact

Lateral meniscus:  Intact

LIGAMENTS

Cruciates:  Intact

Collaterals:  Intact

CARTILAGE

Patellofemoral:  Normal

Medial:  Normal

Lateral:  Normal

Joint:  No joint effusion.

Popliteal Fossa:  No popliteal mass or Baker's cyst.

Extensor Mechanism: The patella retinacular structures are intact
and the quadriceps and patellar tendons are intact.

Bones: There is a large area of fairly marked marrow edema involving
the medial femoral condyle. I think this is most likely due to a
subchondral stress fracture or possible spontaneous osteonecrosis.

There is a fairly large osteochondroma projecting off the posterior
aspect of the medial tibia. No worrisome MR imaging features are
identified. Part of this could impinge on the posterior aspect of
the medial femoral condyle and there could be altered mechanics.

Other: Normal knee musculature.
IMPRESSION: 1. Suspect subchondral stress fracture or focus of spontaneous
osteonecrosis involving the medial femoral condyle.
2. Large osteochondroma projecting off the medial and posterior
aspect of the medial tibia. This could partially impinge on the
medial femoral condyle posteriorly and potentially alter knee
mechanics.
3. Intact ligamentous structures and no meniscal tears.

## 2020-04-25 DIAGNOSIS — R599 Enlarged lymph nodes, unspecified: Secondary | ICD-10-CM | POA: Diagnosis not present

## 2020-04-25 DIAGNOSIS — R0981 Nasal congestion: Secondary | ICD-10-CM | POA: Diagnosis not present

## 2020-04-25 DIAGNOSIS — R059 Cough, unspecified: Secondary | ICD-10-CM | POA: Diagnosis not present

## 2020-07-18 DIAGNOSIS — H60391 Other infective otitis externa, right ear: Secondary | ICD-10-CM | POA: Diagnosis not present

## 2020-07-18 DIAGNOSIS — H6500 Acute serous otitis media, unspecified ear: Secondary | ICD-10-CM | POA: Diagnosis not present

## 2020-08-28 DIAGNOSIS — Z125 Encounter for screening for malignant neoplasm of prostate: Secondary | ICD-10-CM | POA: Diagnosis not present

## 2020-08-28 DIAGNOSIS — Z1159 Encounter for screening for other viral diseases: Secondary | ICD-10-CM | POA: Diagnosis not present

## 2020-08-28 DIAGNOSIS — Z13220 Encounter for screening for lipoid disorders: Secondary | ICD-10-CM | POA: Diagnosis not present

## 2020-08-28 DIAGNOSIS — I10 Essential (primary) hypertension: Secondary | ICD-10-CM | POA: Diagnosis not present

## 2020-08-28 DIAGNOSIS — Z23 Encounter for immunization: Secondary | ICD-10-CM | POA: Diagnosis not present

## 2020-08-28 DIAGNOSIS — Z136 Encounter for screening for cardiovascular disorders: Secondary | ICD-10-CM | POA: Diagnosis not present

## 2020-09-25 DIAGNOSIS — M25562 Pain in left knee: Secondary | ICD-10-CM | POA: Diagnosis not present

## 2020-09-25 DIAGNOSIS — R7303 Prediabetes: Secondary | ICD-10-CM | POA: Diagnosis not present

## 2020-09-25 DIAGNOSIS — I10 Essential (primary) hypertension: Secondary | ICD-10-CM | POA: Diagnosis not present

## 2020-09-25 DIAGNOSIS — L309 Dermatitis, unspecified: Secondary | ICD-10-CM | POA: Diagnosis not present

## 2020-09-25 DIAGNOSIS — M25561 Pain in right knee: Secondary | ICD-10-CM | POA: Diagnosis not present

## 2020-09-25 DIAGNOSIS — R42 Dizziness and giddiness: Secondary | ICD-10-CM | POA: Diagnosis not present

## 2020-09-25 DIAGNOSIS — E785 Hyperlipidemia, unspecified: Secondary | ICD-10-CM | POA: Diagnosis not present

## 2020-09-25 DIAGNOSIS — Z1211 Encounter for screening for malignant neoplasm of colon: Secondary | ICD-10-CM | POA: Diagnosis not present

## 2020-10-01 ENCOUNTER — Other Ambulatory Visit (HOSPITAL_BASED_OUTPATIENT_CLINIC_OR_DEPARTMENT_OTHER): Payer: Self-pay

## 2020-10-01 DIAGNOSIS — G478 Other sleep disorders: Secondary | ICD-10-CM

## 2020-10-16 ENCOUNTER — Encounter: Payer: BC Managed Care – PPO | Admitting: Neurology

## 2021-09-29 ENCOUNTER — Ambulatory Visit: Payer: Medicaid Other | Admitting: Internal Medicine

## 2021-09-30 NOTE — Progress Notes (Signed)
?Cardiology Office Note:   ? ?Date:  10/01/2021  ? ?ID:  Jesse Ruiz, DOB Jul 08, 1966, MRN [id number] ? ?PCP:  Wilburt Finlay, MD  ? ?[facility] ?Cardiologist:  Lenna Sciara, MD ?Referring MD: Wilburt Finlay, MD  ? ?Chief Complaint/Reason for Referral: Chest pain ? ?ASSESSMENT:   ? ?1. Chest pain, unspecified type   ?2. Hypertension, unspecified type   ?3. Hyperlipidemia, unspecified hyperlipidemia type   ?4. Tobacco abuse   ?5. BMI 40.0-44.9, adult ([place])   ?6. Borderline diabetes   ? ? ?PLAN:   ? ?In order of problems listed above: ?1.   We will obtain a coronary CTA and echocardiogram to evaluate further.  If the patient has mild obstructive coronary artery disease, they will require a statin (with goal LDL < 70) and aspirin, if they have high-grade disease we will need to consider optimal medical therapy and if symptoms are refractory to medical therapy, then a cardiac catheterization with possible PCI will be pursued to alleviate symptoms.  If they have high risk disease we will proceed directly to cardiac catheterization.  Follow up in 6 months years.  If our evaluation is negative this may suggest that his chest discomfort was from high blood pressure or alternatively a GI etiology. ?2.  Hypertension ?3.  This is being followed by the patient's primary care provider. ?4.  Stressed need for abstinence. ?5.  Stressed need for diet and exercise modification and may need to consider pharmacotherapy in the future.   ?82.  Being followed by PCP; if he becomes  diabetic, will need ASA, statin, and SGLT2i.   ?     ? ?  ? ?Dispo:  Return in about 6 months (around 04/03/2022).  ? ?  ? ?Medication Adjustments/Labs and Tests Ordered: ?Current medicines are reviewed at length with the patient today.  Concerns regarding medicines are outlined above.  ? ?Tests Ordered: ?No orders of the defined types were placed in this encounter. ? ? ?Medication Changes: ?No orders of the defined types were placed in this  encounter. ? ? ?History of Present Illness:   ? ?FOCUSED PROBLEM LIST:   ?1.  Hypertension ?2.  Hyperlipidemia ?3.  Ongoing tobacco abuse  ?4.  BMI of 42.8 ?5.  Borderline T2DM  ?6.  OA s/p multiple knee surgeries ? ?The patient is a 56 y.o. male with the indicated medical history here for chest pain.  The patient was seen by his primary care provider recently.  He reported an episode of chest pain with radiation down his left arm.  His blood pressure at the time was around 248mmHg.  He noted that his blood pressure was elevated for a few more days and still had some degree of chest pain.  He eventually was seen in an area urgent care.  Per report his EKG and other testing was abnormal.  He was prescribed as needed nitroglycerin by his PCP.  Since his episode a couple of weeks ago he has not really had much by way of chest pain.  He walks his property without any exertional angina.  He has had some shortness of breath with wheezing.  He does continue to smoke.  He does have a ventral hernia as well that he is dealing with.  He denies any significant peripheral edema, Paraschos nocturnal dyspnea, orthopnea.  He was supposed to get a sleep study but that in being canceled.  He will be following up with his PCP about this.  He has  required no additional urgent care visits or emergency room visits for chest pain or high blood pressure.  Of note due to his multiple knee operations he has gained about 40 pounds over the last several years.  He is actively trying to lose this weight. ? ?   ? ?Current Medications: ?Current Meds  ?Medication Sig  ? albuterol (VENTOLIN HFA) 108 (90 Base) MCG/ACT inhaler Inhale into the lungs every 6 (six) hours as needed for wheezing or shortness of breath. For sinus infection  ? lisinopril (ZESTRIL) 5 MG tablet Take 5 mg by mouth daily.  ? methocarbamol (ROBAXIN) 500 MG tablet Take 1 tablet (500 mg total) by mouth 2 (two) times daily with a meal.  ? Multiple Vitamins-Minerals (MULTIVITAMIN  MEN) TABS Take 1 tablet by mouth daily.  ? nitroGLYCERIN (NITROSTAT) 0.4 MG SL tablet Place 1 tablet under the tongue as needed.  ? rosuvastatin (CRESTOR) 10 MG tablet Take 10 mg by mouth daily.  ? RYBELSUS 3 MG TABS Take 1 tablet by mouth daily.  ?  ? ?Allergies:    ?Oxycodone  ? ?Social History:   ?Social History  ? ?Tobacco Use  ? Smoking status: Every Day  ?  Packs/day: 0.50  ?  Years: 30.00  ?  Pack years: 15.00  ?  Types: Cigarettes  ? Smokeless tobacco: Never  ? Tobacco comments:  ?  slowed down  ?Vaping Use  ? Vaping Use: Never used  ?Substance Use Topics  ? Alcohol use: Yes  ?  Alcohol/week: 12.0 standard drinks  ?  Types: 12 Cans of beer per week  ?  Comment: 12 pk per week  ? Drug use: Never  ?  ? ?Family Hx: ?History reviewed. No pertinent family history.  ? ?Review of Systems:   ?Please see the history of present illness.    ?All other systems reviewed and are negative. ?  ? ? ?EKGs/Labs/Other Test Reviewed:   ? ?EKG: EKG 2020 demonstrates sinus rhythm; EKG today demonstrates sinus rhythm ? ?Prior CV studies: ?None available ? ?Imaging studies that I have independently reviewed today: None relevant ? ?Recent Labs: ?No results found for requested labs within last 8760 hours.  ? ?Recent Lipid Panel ?No results found for: CHOL, TRIG, HDL, LDLCALC, LDLDIRECT ? ?Risk Assessment/Calculations:   ? ? ?    ? ?Physical Exam:   ? ?VS:  BP 130/78   Pulse 71   Ht 5' 10.5" (1.791 m)   Wt 287 lb (130.2 kg)   SpO2 98%   BMI 40.60 kg/m?    ?Wt Readings from Last 3 Encounters:  ?10/01/21 287 lb (130.2 kg)  ?05/07/19 269 lb 7 oz (122.2 kg)  ?04/30/19 269 lb 7 oz (122.2 kg)  ?  ?GENERAL:  No apparent distress, AOx3 ?HEENT:  No carotid bruits, +2 carotid impulses, no scleral icterus ?CAR: RRR no murmurs, gallops, rubs, or thrills ?RES:  Clear to auscultation bilaterally ?ABD:  Soft, nontender, nondistended, positive bowel sounds x 4 ?VASC:  +2 radial pulses, +2 carotid pulses, palpable pedal pulses ?NEURO:  CN 2-12  grossly intact; motor and sensory grossly intact ?PSYCH:  No active depression or anxiety ?EXT:  No edema, ecchymosis, or cyanosis ? ?Signed, ?Early Osmond, MD  ?10/01/2021 2:59 PM    ?[facility] [address], [place], [place]  [address] ?Phone: [phone]; Fax: [phone]  ? ?Note:  This document was prepared using Dragon voice recognition software and may include unintentional dictation errors. ?

## 2021-10-01 ENCOUNTER — Other Ambulatory Visit: Payer: Self-pay

## 2021-10-01 ENCOUNTER — Ambulatory Visit (INDEPENDENT_AMBULATORY_CARE_PROVIDER_SITE_OTHER): Payer: Medicare Other | Admitting: Internal Medicine

## 2021-10-01 ENCOUNTER — Encounter: Payer: Self-pay | Admitting: Internal Medicine

## 2021-10-01 VITALS — BP 130/78 | HR 71 | Ht 70.5 in | Wt 287.0 lb

## 2021-10-01 DIAGNOSIS — R079 Chest pain, unspecified: Secondary | ICD-10-CM | POA: Diagnosis not present

## 2021-10-01 DIAGNOSIS — Z720 Tobacco use: Secondary | ICD-10-CM | POA: Diagnosis not present

## 2021-10-01 DIAGNOSIS — I10 Essential (primary) hypertension: Secondary | ICD-10-CM | POA: Diagnosis not present

## 2021-10-01 DIAGNOSIS — E785 Hyperlipidemia, unspecified: Secondary | ICD-10-CM | POA: Diagnosis not present

## 2021-10-01 DIAGNOSIS — Z6841 Body Mass Index (BMI) 40.0 and over, adult: Secondary | ICD-10-CM

## 2021-10-01 MED ORDER — METOPROLOL TARTRATE 100 MG PO TABS: 100.0000 mg | ORAL_TABLET | Freq: Once | ORAL | 0 refills | Status: DC

## 2021-10-01 NOTE — Patient Instructions (Signed)
Medication Instructions:  ?No changes ?*If you need a refill on your cardiac medications before your next appointment, please call your pharmacy* ? ? ?Lab Work: ?none ? ?Testing/Procedures: ?Your physician has requested that you have an echocardiogram. Echocardiography is a painless test that uses sound waves to create images of your heart. It provides your doctor with information about the size and shape of your heart and how well your heart?s chambers and valves are working. This procedure takes approximately one hour. There are no restrictions for this procedure. ? ?Coronary CTA - see instructions below ? ? ?Follow-Up: ?At [facility], you and your health needs are our priority.  As part of our continuing mission to provide you with exceptional heart care, we have created designated Provider Care Teams.  These Care Teams include your primary Cardiologist (physician) and Advanced Practice Providers (APPs -  Physician Assistants and Nurse Practitioners) who all work together to provide you with the care you need, when you need it. ? ?We recommend signing up for the patient portal called "MyChart".  Sign up information is provided on this After Visit Summary.  MyChart is used to connect with patients for Virtual Visits (Telemedicine).  Patients are able to view lab/test results, encounter notes, upcoming appointments, etc.  Non-urgent messages can be sent to your provider as well.   ?To learn more about what you can do with MyChart, go to [email or web address].   ? ?Your next appointment:   ?6 month(s) ? ?The format for your next appointment:   ?In Person ? ?Provider:   ?Orbie Pyo, MD   ? ? ?Other Instructions ? ? ?Your cardiac CT will be scheduled at  ?[facility] ?[address] ?[place], [place] [address] ?[phone] ? ?Please arrive at the [facility] and Children's Entrance (Entrance C2) of [facility] 30 minutes prior to test start time. ?You can use the FREE valet parking  offered at entrance C (encouraged to control the heart rate for the test)  ?Proceed to the [facility] Radiology Department (first floor) to check-in and test prep. ? ?All radiology patients and guests should use entrance C2 at [facility], accessed from [facility], even though the hospital's physical address listed is [address] ? ? ? ?Please follow these instructions carefully (unless otherwise directed): ? ?Hold all erectile dysfunction medications at least 3 days (72 hrs) prior to test. ? ?On the Night Before the Test: ?Be sure to Drink plenty of water. ?Do not consume any caffeinated/decaffeinated beverages or chocolate 12 hours prior to your test. ?Do not take any antihistamines 12 hours prior to your test. ? ?On the Day of the Test: ?Drink plenty of water until 1 hour prior to the test. ?Do not eat any food 4 hours prior to the test. ?You may take your regular medications prior to the test.  ?Take metoprolol (Lopressor) two hours prior to test. ? ?     ?After the Test: ?Drink plenty of water. ?After receiving IV contrast, you may experience a mild flushed feeling. This is normal. ?On occasion, you may experience a mild rash up to 24 hours after the test. This is not dangerous. If this occurs, you can take Benadryl 25 mg and increase your fluid intake. ?If you experience trouble breathing, this can be serious. If it is severe call 911 IMMEDIATELY. If it is mild, please call our office. ?If you take any of these medications: Glipizide/Metformin, Avandament, Glucavance, please do not take 48  hours after completing test unless otherwise instructed. ? ?We will call to schedule your test 2-4 weeks out understanding that some insurance companies will need an authorization prior to the service being performed.  ? ?For non-scheduling related questions, please contact the cardiac imaging nurse navigator should you have any questions/concerns: ?Rockwell Alexandria, Cardiac Imaging Nurse  Navigator ?Larey Brick, Cardiac Imaging Nurse Navigator ?[facility] ?Direct Office Dial: [phone]  ? ?For scheduling needs, including cancellations and rescheduling, please call [place], [phone]. ?  ?

## 2021-10-12 ENCOUNTER — Ambulatory Visit (HOSPITAL_COMMUNITY): Payer: Medicare Other | Attending: Cardiology

## 2021-10-12 ENCOUNTER — Other Ambulatory Visit: Payer: Self-pay

## 2021-10-12 DIAGNOSIS — R079 Chest pain, unspecified: Secondary | ICD-10-CM | POA: Insufficient documentation

## 2021-10-12 DIAGNOSIS — E785 Hyperlipidemia, unspecified: Secondary | ICD-10-CM | POA: Diagnosis present

## 2021-10-12 DIAGNOSIS — R072 Precordial pain: Secondary | ICD-10-CM | POA: Diagnosis present

## 2021-10-12 DIAGNOSIS — Z720 Tobacco use: Secondary | ICD-10-CM | POA: Diagnosis present

## 2021-10-12 DIAGNOSIS — R7303 Prediabetes: Secondary | ICD-10-CM | POA: Diagnosis present

## 2021-10-12 DIAGNOSIS — Z6841 Body Mass Index (BMI) 40.0 and over, adult: Secondary | ICD-10-CM | POA: Insufficient documentation

## 2021-10-12 DIAGNOSIS — I10 Essential (primary) hypertension: Secondary | ICD-10-CM | POA: Insufficient documentation

## 2021-10-12 LAB — ECHOCARDIOGRAM COMPLETE
Area-P 1/2: 3.21 cm2
S' Lateral: 3 cm

## 2021-10-13 ENCOUNTER — Telehealth (HOSPITAL_COMMUNITY): Payer: Self-pay | Admitting: *Deleted

## 2021-10-13 NOTE — Telephone Encounter (Signed)
Attempted to call patient regarding upcoming cardiac CT appointment. °Left message on voicemail with name and callback number ° °Neville Pauls RN Navigator Cardiac Imaging °[facility] °[phone] Office °[phone] Cell ° °

## 2021-10-14 ENCOUNTER — Ambulatory Visit (HOSPITAL_COMMUNITY)
Admission: RE | Admit: 2021-10-14 | Discharge: 2021-10-14 | Disposition: A | Discharge status: Home / Self Care | Payer: Medicare Other | Source: Ambulatory Visit | Attending: Internal Medicine | Admitting: Internal Medicine

## 2021-10-14 DIAGNOSIS — I10 Essential (primary) hypertension: Secondary | ICD-10-CM | POA: Insufficient documentation

## 2021-10-14 DIAGNOSIS — R079 Chest pain, unspecified: Secondary | ICD-10-CM | POA: Insufficient documentation

## 2021-10-14 DIAGNOSIS — E785 Hyperlipidemia, unspecified: Secondary | ICD-10-CM | POA: Diagnosis present

## 2021-10-14 DIAGNOSIS — Z6841 Body Mass Index (BMI) 40.0 and over, adult: Secondary | ICD-10-CM | POA: Diagnosis present

## 2021-10-14 DIAGNOSIS — R7303 Prediabetes: Secondary | ICD-10-CM | POA: Insufficient documentation

## 2021-10-14 DIAGNOSIS — R072 Precordial pain: Secondary | ICD-10-CM | POA: Diagnosis present

## 2021-10-14 DIAGNOSIS — Z720 Tobacco use: Secondary | ICD-10-CM | POA: Insufficient documentation

## 2021-10-14 MED ORDER — NITROGLYCERIN 0.4 MG SL SUBL
0.8000 mg | SUBLINGUAL_TABLET | Freq: Once | SUBLINGUAL | Status: AC
  Administered 2021-10-14: 0.8 mg via SUBLINGUAL

## 2021-10-14 MED ORDER — NITROGLYCERIN 0.4 MG SL SUBL
SUBLINGUAL_TABLET | SUBLINGUAL | Status: AC
  Filled 2021-10-14: qty 2

## 2021-10-14 MED ORDER — IOHEXOL 350 MG/ML SOLN
100.0000 mL | Freq: Once | INTRAVENOUS | Status: AC | PRN
  Administered 2021-10-14: 100 mL via INTRAVENOUS

## 2021-10-14 NOTE — Progress Notes (Signed)
CT scan completed. Tolerated well. D/C home ambulatory, awake and alert. In no distress. 

## 2021-10-16 ENCOUNTER — Telehealth: Payer: Self-pay | Admitting: *Deleted

## 2021-10-16 MED ORDER — ASPIRIN EC 81 MG PO TBEC: 81.0000 mg | DELAYED_RELEASE_TABLET | Freq: Every day | ORAL | 3 refills | Status: DC

## 2021-10-16 NOTE — Telephone Encounter (Signed)
-----   Message from Arun K Thukkani, MD sent at 10/15/2021  7:06 AM EDT ----- ?Has very mild blockages that would not cause chest pain.  Cont crestor and start ASA 81.  Needs to follow up with PCP about pulm nodules, please make sure PCP office knows.   ?

## 2021-10-16 NOTE — Telephone Encounter (Signed)
Patient has been notified of the results and I will forward to his PCP for further follow up of pulm nodules.  Asa added to med list and he will continue Crestor. ?

## 2021-10-16 NOTE — Telephone Encounter (Signed)
-----   Message from Orbie Pyo, MD sent at 10/15/2021  7:06 AM EDT ----- ?Has very mild blockages that would not cause chest pain.  Cont crestor and start ASA 81.  Needs to follow up with PCP about pulm nodules, please make sure PCP office knows.   ?

## 2021-10-16 NOTE — Telephone Encounter (Signed)
Left message for patient to call back  

## 2022-04-13 NOTE — Progress Notes (Unsigned)
Cardiology Office Note:    Date:  04/14/2022   ID:  Jesse Ruiz, DOB 1965/12/22, MRN [id number]  PCP:  Wilburt Finlay, MD   [facility] Prov[facility] Cardiologist:  Lenna Sciara, MD Referring MD: Wilburt Finlay, MD   Chief Complaint/Reason for Referral: Chest pain  ASSESSMENT:    1. Mild CAD   2. Hypertension, unspecified type   3. Hyperlipidemia LDL goal <70   4. Tobacco abuse   5. BMI 40.0-44.9, adult ([place])   6. Borderline diabetes   7. Aortic dilatation (HCC)      PLAN:    In order of problems listed above: 1.   Chest pain: He has had a reassuring cardiac evaluation and has had no recurrent chest pain.  We will monitor for now.. 2.  Hypertension:  BP is well controlled, continue lisinopril for mild LVH. 3.  Hyperlipidemia: We will check lipid panel, LFTs, and LP(a) today.  Goal LDL is less than 70 4.  Tobacco abuse: Stressed need for abstinence.  We will obtain abdominal ultrasound to screen for aneurysm.  He has no signs or symptoms of claudication. 5.  Elevated BMI: After report he has lost 15 pounds.  I have encouraged him to continue with his diet and lifestyle modification.  I recommended that he switch from beer to red wine as well. 6.  Borderline diabetes: Being followed by PCP; if he becomes  diabetic, consider SGLT2i.   7.  Aortic dilatation: Only mild on echocardiogram we will plan on imaging in a few years time.          Dispo:  Return in about 1 year (around 04/15/2023).      Medication Adjustments/Labs and Tests Ordered: Current medicines are reviewed at length with the patient today.  Concerns regarding medicines are outlined above.   Tests Ordered: Orders Placed This Encounter  Procedures   Lipid panel   Hepatic function panel   Lipoprotein A (LPA)   VAS [place] AAA DUPLEX    Medication Changes: No orders of the defined types were placed in this encounter.   History of Present Illness:    FOCUSED PROBLEM LIST:   1.  Hypertension 2.   Hyperlipidemia 3.  Ongoing tobacco abuse  4.  BMI of 42.8 5.  Borderline T2DM  6.  OA s/p multiple knee surgeries 7.  Mild coronary artery disease on CTA March 2023  March 2023 initial consultation:  The patient is a 56 y.o. male with the indicated medical history here for chest pain.  The patient was seen by his primary care provider recently.  He reported an episode of chest pain with radiation down his left arm.  His blood pressure at the time was around 249mmHg.  He noted that his blood pressure was elevated for a few more days and still had some degree of chest pain.  He eventually was seen in an area urgent care.  Per report his EKG and other testing was abnormal.  He was prescribed as needed nitroglycerin by his PCP.  Since his episode a couple of weeks ago he has not really had much by way of chest pain.  He walks his property without any exertional angina.  He has had some shortness of breath with wheezing.  He does continue to smoke.  He does have a ventral hernia as well that he is dealing with.  He denies any significant peripheral edema, Paraschos nocturnal dyspnea, orthopnea.  He was supposed to get a sleep study but  that in being canceled.  He will be following up with his PCP about this.  He has required no additional urgent care visits or emergency room visits for chest pain or high blood pressure.  Of note due to his multiple knee operations he has gained about 40 pounds over the last several years.  He is actively trying to lose this weight.  Plan: Obtain coronary CTA, and echocardiogram.  Today: The patient returns for routine follow-up.  He had a reassuring CTA and echocardiogram.  He was started on aspirin and his Crestor was continued.  The patient is doing well.  He has had no recurrence of chest pain.  He has lost 15 pounds by changing his diet and exercising.  He bought an aboveground pool to help given his orthopedic issues.  His major elements really stemming from his chronic  knee pain and back pain.  He denies any presyncope, syncope, palpitations, or any issues with his medications.  He is otherwise doing well and without complaints.      Current Medications: Current Meds  Medication Sig   albuterol (VENTOLIN HFA) 108 (90 Base) MCG/ACT inhaler Inhale into the lungs every 6 (six) hours as needed for wheezing or shortness of breath. For sinus infection   aspirin EC 81 MG tablet Take 1 tablet (81 mg total) by mouth daily. Swallow whole.   lisinopril (ZESTRIL) 5 MG tablet Take 5 mg by mouth daily.   Multiple Vitamins-Minerals (MULTIVITAMIN MEN) TABS Take 1 tablet by mouth daily.   nitroGLYCERIN (NITROSTAT) 0.4 MG SL tablet Place 1 tablet under the tongue as needed.   rosuvastatin (CRESTOR) 10 MG tablet Take 10 mg by mouth daily.   RYBELSUS 3 MG TABS Take 1 tablet by mouth daily.     Allergies:    Oxycodone   Social History:   Social History   Tobacco Use   Smoking status: Every Day    Packs/day: 0.50    Years: 30.00    Total pack years: 15.00    Types: Cigarettes   Smokeless tobacco: Never   Tobacco comments:    slowed down  Vaping Use   Vaping Use: Never used  Substance Use Topics   Alcohol use: Yes    Alcohol/week: 12.0 standard drinks of alcohol    Types: 12 Cans of beer per week    Comment: 12 pk per week   Drug use: Never     Family Hx: History reviewed. No pertinent family history.   Review of Systems:   Please see the history of present illness.    All other systems reviewed and are negative.     EKGs/Labs/Other Test Reviewed:    EKG: EKG 2020 demonstrates sinus rhythm; EKG today demonstrates sinus rhythm  Prior CV studies:  March 2023 coronary CTA: IMPRESSION: 1. Coronary calcium score of 40.8. This was 29 percentile for age-, sex, and race-matched controls.   2. Normal coronary origin with right dominance.   3. Minimal CAD as outlined.   4. Dilated pulmonary artery (32 mm) suggestive of pulmonary hypertension.    RECOMMENDATIONS: CAD-RADS 1: Minimal non-obstructive CAD (0-24%). Consider non-atherosclerotic causes of chest pain. Consider preventive therapy and risk factor modification.  TTE 2023:  1. Left ventricular ejection fraction, by estimation, is 55 to 60%. The  left ventricle has normal function. The left ventricle has no regional  wall motion abnormalities. There is mild concentric left ventricular  hypertrophy. Left ventricular diastolic  parameters were normal.   2. Right ventricular systolic  function is normal. The right ventricular  size is normal. Tricuspid regurgitation signal is inadequate for assessing  PA pressure.   3. The mitral valve is normal in structure. Trivial mitral valve  regurgitation.   4. The aortic valve is tricuspid. There is mild calcification of the  aortic valve. There is mild thickening of the aortic valve. Aortic valve  regurgitation is trivial. Aortic valve sclerosis/calcification is present,  without any evidence of aortic  stenosis.   5. Aortic dilatation noted. There is borderline dilatation of the  ascending aorta, measuring 36 mm.     Imaging studies that I have independently reviewed today: CTA and echocardiogram  Recent Labs: No results found for requested labs within last 365 days.   Recent Lipid Panel No results found for: "CHOL", "TRIG", "HDL", "LDLCALC", "LDLDIRECT"  Risk Assessment/Calculations:          Physical Exam:    VS:  BP 126/82   Pulse 69   Ht 5' 10.5" (1.791 m)   Wt 273 lb 3.2 oz (123.9 kg)   SpO2 97%   BMI 38.65 kg/m    Wt Readings from Last 3 Encounters:  04/14/22 273 lb 3.2 oz (123.9 kg)  10/01/21 287 lb (130.2 kg)  05/07/19 269 lb 7 oz (122.2 kg)    GENERAL:  No apparent distress, AOx3 HEENT:  No carotid bruits, +2 carotid impulses, no scleral icterus CAR: RRR no murmurs, gallops, rubs, or thrills RES:  Clear to auscultation bilaterally ABD:  Soft, nontender, nondistended, positive bowel sounds x  4 VASC:  +2 radial pulses, +2 carotid pulses, palpable pedal pulses NEURO:  CN 2-12 grossly intact; motor and sensory grossly intact PSYCH:  No active depression or anxiety EXT:  No edema, ecchymosis, or cyanosis  Signed, Early Osmond, MD  04/14/2022 8:35 AM    [facility] [place], [place], [place]  [address] Phone: [phone]; Fax: [phone]   Note:  This document was prepared using Dragon voice recognition software and may include unintentional dictation errors.

## 2022-04-14 ENCOUNTER — Encounter: Payer: Self-pay | Admitting: Internal Medicine

## 2022-04-14 ENCOUNTER — Ambulatory Visit: Payer: Medicare Other | Attending: Internal Medicine | Admitting: Internal Medicine

## 2022-04-14 VITALS — BP 126/82 | HR 69 | Ht 70.5 in | Wt 273.2 lb

## 2022-04-14 DIAGNOSIS — E785 Hyperlipidemia, unspecified: Secondary | ICD-10-CM | POA: Diagnosis present

## 2022-04-14 DIAGNOSIS — I77819 Aortic ectasia, unspecified site: Secondary | ICD-10-CM | POA: Insufficient documentation

## 2022-04-14 DIAGNOSIS — R7303 Prediabetes: Secondary | ICD-10-CM | POA: Insufficient documentation

## 2022-04-14 DIAGNOSIS — I2510 Atherosclerotic heart disease of native coronary artery without angina pectoris: Secondary | ICD-10-CM | POA: Insufficient documentation

## 2022-04-14 DIAGNOSIS — I10 Essential (primary) hypertension: Secondary | ICD-10-CM | POA: Diagnosis not present

## 2022-04-14 DIAGNOSIS — Z6841 Body Mass Index (BMI) 40.0 and over, adult: Secondary | ICD-10-CM | POA: Insufficient documentation

## 2022-04-14 DIAGNOSIS — Z720 Tobacco use: Secondary | ICD-10-CM | POA: Diagnosis present

## 2022-04-14 NOTE — Patient Instructions (Signed)
Medication Instructions:  No changes *If you need a refill on your cardiac medications before your next appointment, please call your pharmacy*   Lab Work: Today: lipids/liver/Lp(a)  If you have labs (blood work) drawn today and your tests are completely normal, you will receive your results only by: [place] (if you have MyChart) OR A paper copy in the mail If you have any lab test that is abnormal or we need to change your treatment, we will call you to review the results.   Testing/Procedures: Your physician has requested that you have an abdominal aorta duplex. During this test, an ultrasound is used to evaluate the aorta. Allow 30 minutes for this exam. Do not eat after midnight the day before and avoid carbonated beverages   Follow-Up: At [facility], you and your health needs are our priority.  As part of our continuing mission to provide you with exceptional heart care, we have created designated Provider Care Teams.  These Care Teams include your primary Cardiologist (physician) and Advanced Practice Providers (APPs -  Physician Assistants and Nurse Practitioners) who all work together to provide you with the care you need, when you need it.   Your next appointment:   12 month(s)  The format for your next appointment:   In Person  Provider:   Early Osmond, MD     Important Information About Sugar

## 2022-04-15 ENCOUNTER — Telehealth: Payer: Self-pay | Admitting: *Deleted

## 2022-04-15 DIAGNOSIS — E785 Hyperlipidemia, unspecified: Secondary | ICD-10-CM

## 2022-04-15 DIAGNOSIS — E7841 Elevated Lipoprotein(a): Secondary | ICD-10-CM

## 2022-04-15 LAB — HEPATIC FUNCTION PANEL
ALT: 43 IU/L (ref 0–44)
AST: 35 IU/L (ref 0–40)
Albumin: 5 g/dL — ABNORMAL HIGH (ref 3.8–4.9)
Alkaline Phosphatase: 69 IU/L (ref 44–121)
Bilirubin Total: 0.9 mg/dL (ref 0.0–1.2)
Bilirubin, Direct: 0.26 mg/dL (ref 0.00–0.40)
Total Protein: 7.2 g/dL (ref 6.0–8.5)

## 2022-04-15 LAB — LIPID PANEL
Chol/HDL Ratio: 2.9 ratio (ref 0.0–5.0)
Cholesterol, Total: 143 mg/dL (ref 100–199)
HDL: 49 mg/dL (ref >39)
LDL Chol Calc (NIH): 79 mg/dL (ref 0–99)
Triglycerides: 75 mg/dL (ref 0–149)
VLDL Cholesterol Cal: 15 mg/dL (ref 5–40)

## 2022-04-15 LAB — LIPOPROTEIN A (LPA): Lipoprotein (a): 118.9 nmol/L — ABNORMAL HIGH (ref <75.0)

## 2022-04-15 MED ORDER — ROSUVASTATIN CALCIUM 20 MG PO TABS: 20.0000 mg | ORAL_TABLET | Freq: Every day | ORAL | 3 refills | Status: DC

## 2022-04-15 NOTE — Telephone Encounter (Signed)
04/15/2022  7:44 AM EDT     Lets increase his Crestor to 20 mg, get a lipid panel LFTs in 2 months, and refer to Dr. Debara Pickett for elevated LP(a) level.  _________________________________________________________________   Orders placed.  Referral placed.  Updated patient via [place].

## 2022-04-21 ENCOUNTER — Ambulatory Visit (HOSPITAL_COMMUNITY)
Admission: RE | Admit: 2022-04-21 | Discharge: 2022-04-21 | Disposition: A | Discharge status: Home / Self Care | Payer: Medicare Other | Source: Ambulatory Visit | Attending: Internal Medicine | Admitting: Internal Medicine

## 2022-04-21 DIAGNOSIS — Z6841 Body Mass Index (BMI) 40.0 and over, adult: Secondary | ICD-10-CM | POA: Diagnosis present

## 2022-04-21 DIAGNOSIS — R7303 Prediabetes: Secondary | ICD-10-CM | POA: Diagnosis present

## 2022-04-21 DIAGNOSIS — R1900 Intra-abdominal and pelvic swelling, mass and lump, unspecified site: Secondary | ICD-10-CM

## 2022-04-21 DIAGNOSIS — I2510 Atherosclerotic heart disease of native coronary artery without angina pectoris: Secondary | ICD-10-CM | POA: Diagnosis not present

## 2022-04-21 DIAGNOSIS — I10 Essential (primary) hypertension: Secondary | ICD-10-CM | POA: Insufficient documentation

## 2022-04-21 DIAGNOSIS — Z720 Tobacco use: Secondary | ICD-10-CM | POA: Diagnosis present

## 2022-04-21 DIAGNOSIS — E785 Hyperlipidemia, unspecified: Secondary | ICD-10-CM | POA: Diagnosis not present

## 2022-04-21 DIAGNOSIS — I77819 Aortic ectasia, unspecified site: Secondary | ICD-10-CM | POA: Diagnosis present

## 2022-10-25 ENCOUNTER — Ambulatory Visit: Payer: Medicare Other | Attending: Internal Medicine | Admitting: Internal Medicine

## 2023-04-17 NOTE — Progress Notes (Unsigned)
Cardiology Office Note:   Date:  04/18/2023  ID:  Jesse Ruiz, DOB 09-Jun-1966, MRN [id number] PCP: Beatrix Fetters, MD  [facility] Providers Cardiologist:  Orbie Pyo, MD    History of Present Illness:   Discussed the use of AI scribe software for clinical note transcription with the patient, who gave verbal consent to proceed.  History of Present Illness   The patient, a 57 year old male with a history of mild CAD, hypertension, and high cholesterol, was referred to cardiology in March 2023 following an episode of chest pain radiating down the arm. At the time, he also had high blood pressure and an abnormal EKG. A cardiac CTA was performed, revealing a coronary calcium score of 40.8 and minimal coronary artery disease. He was prescribed aspirin and Crestor. An echocardiogram showed normal heart function with trivial aortic valve regurgitation.  Since the last visit, he has made significant lifestyle changes, including adopting a low-carb diet and losing weight. He reports no recurrence of chest pain or shortness of breath. However, he has been experiencing leg pain, particularly at night, which he describes as a freezing sensation followed by intense pain. He has a history of knee replacement surgery and has been experiencing ongoing knee issues.  He has been taking daily low-dose aspirin, lisinopril for blood pressure, and a cholesterol medication. However, he stopped taking the cholesterol medication due to severe side effects resembling flu symptoms. He also ran out of lisinopril and has not had it refilled. He continues to take daily aspirin and ibuprofen for leg pain.  He has a history of smoking and has reduced consumption to about a pack a day, with attempts to quit by switching to vaping. He also has a history of borderline diabetes and a slightly dilated aorta, as noted on an echocardiogram from two years ago.       Today patient denies chest pain, shortness of breath,  lower extremity edema, fatigue, palpitations, melena, hematuria, hemoptysis, diaphoresis, weakness, presyncope, syncope, orthopnea, and PND.   Studies Reviewed:    EKG:   EKG Interpretation Date/Time:  Monday April 18 2023 07:56:48 EDT Ventricular Rate:  63 PR Interval:  198 QRS Duration:  86 QT Interval:  396 QTC Calculation: 405 R Axis:   49  Text Interpretation: Normal sinus rhythm Normal ECG When compared with ECG of 23-Nov-2018 14:21, No significant change was found Confirmed by Perlie Gold [phone]) on 04/18/2023 8:10:02 AM      Risk Assessment/Calculations:              Physical Exam:   VS:  BP 138/84   Pulse 63   Ht 5' 10.5" (1.791 m)   Wt 246 lb 6.4 oz (111.8 kg)   SpO2 97%   BMI 34.86 kg/m    Wt Readings from Last 3 Encounters:  04/18/23 246 lb 6.4 oz (111.8 kg)  04/14/22 273 lb 3.2 oz (123.9 kg)  10/01/21 287 lb (130.2 kg)     Physical Exam Vitals reviewed.  Constitutional:      Appearance: Normal appearance.  HENT:     Head: Normocephalic.  Eyes:     Pupils: Pupils are equal, round, and reactive to light.  Cardiovascular:     Rate and Rhythm: Normal rate and regular rhythm.     Pulses: Normal pulses.     Heart sounds: Normal heart sounds.  Pulmonary:     Effort: Pulmonary effort is normal.     Breath sounds: Normal breath sounds.  Abdominal:  General: Abdomen is flat.     Palpations: Abdomen is soft.  Musculoskeletal:     Right lower leg: No edema.     Left lower leg: No edema.  Skin:    General: Skin is warm and dry.     Capillary Refill: Capillary refill takes less than 2 seconds.  Neurological:     General: No focal deficit present.     Mental Status: He is alert and oriented to person, place, and time.  Psychiatric:        Mood and Affect: Mood normal.        Behavior: Behavior normal.        Thought Content: Thought content normal.        Judgment: Judgment normal.     Physical Exam   VITALS: P- 63, BP-  138/80 MEASUREMENTS: WT- 246 CARDIOVASCULAR: EKG looks fantastic EXTREMITIES: No edema noted in legs       ASSESSMENT AND PLAN:     Assessment and Plan    Hypertension Blood pressure slightly elevated at 138/84. Patient has been off Lisinopril due to running out of medication. -Resume Lisinopril 5mg  daily for blood pressure control and kidney protection. -Check CMP  Hyperlipidemia Patient stopped Crestor due to side effects. He was previously referred to Dr. Rennis Golden but was not seen. -Check lipid panel today. -If cholesterol is high, consider alternative statin or referral back to lipid clinic for evaluation of alternative options.  Chest pain No recurrence of chest pain. EKG looks normal. -Continue daily low-dose aspirin. -Advise against concurrent use of aspirin and ibuprofen due to increased risk of ulcer and bleeding. -Check CBC  Aortic Dilation Mild dilation noted on echocardiogram from March 2023. -Plan to repeat echocardiogram in March 2025 for monitoring.  Leg Pain Patient reports severe leg pain, particularly at night. -Recommend follow-up with orthopedic surgeon for evaluation.  Smoking Patient continues to smoke, albeit less than previously. -Encourage continued efforts to quit smoking, noting that vaping is not a safer alternative from a cardiac standpoint.  Weight Management Significant weight loss since last visit due to dietary changes and increased exercise. -Congratulate on weight loss and encourage continued healthy lifestyle changes.  General Health Maintenance -Recommend establishing care with a primary care provider for overall health monitoring, including potential borderline diabetes. -Plan to follow-up in 2-3 months to monitor response to Lisinopril and discuss cholesterol management.                 Signed, Perlie Gold, PA-C

## 2023-04-18 ENCOUNTER — Encounter: Payer: Self-pay | Admitting: Cardiology

## 2023-04-18 ENCOUNTER — Ambulatory Visit: Payer: Medicare Other | Attending: Cardiology | Admitting: Cardiology

## 2023-04-18 VITALS — BP 138/84 | HR 63 | Ht 70.5 in | Wt 246.4 lb

## 2023-04-18 DIAGNOSIS — E785 Hyperlipidemia, unspecified: Secondary | ICD-10-CM | POA: Insufficient documentation

## 2023-04-18 DIAGNOSIS — I77819 Aortic ectasia, unspecified site: Secondary | ICD-10-CM | POA: Insufficient documentation

## 2023-04-18 DIAGNOSIS — Z720 Tobacco use: Secondary | ICD-10-CM | POA: Insufficient documentation

## 2023-04-18 DIAGNOSIS — I10 Essential (primary) hypertension: Secondary | ICD-10-CM | POA: Diagnosis not present

## 2023-04-18 DIAGNOSIS — I2510 Atherosclerotic heart disease of native coronary artery without angina pectoris: Secondary | ICD-10-CM | POA: Diagnosis not present

## 2023-04-18 LAB — CBC

## 2023-04-18 MED ORDER — ASPIRIN 81 MG PO TBEC: 81.0000 mg | DELAYED_RELEASE_TABLET | Freq: Every day | ORAL | Status: AC

## 2023-04-18 MED ORDER — LISINOPRIL 5 MG PO TABS: 5.0000 mg | ORAL_TABLET | Freq: Every day | ORAL | 3 refills | Status: DC

## 2023-04-18 NOTE — Patient Instructions (Signed)
Medication Instructions:  Your physician has recommended you make the following change in your medication:  START Lisinopril (Zestril) 5mg  1 tablet by mouth daily.  *If you need a refill on your cardiac medications before your next appointment, please call your pharmacy*   Lab Work: CMET, Lipid, and CBC Today.  If you have labs (blood work) drawn today and your tests are completely normal, you will receive your results only by: MyChart Message (if you have MyChart) OR A paper copy in the mail If you have any lab test that is abnormal or we need to change your treatment, we will call you to review the results.   Testing/Procedures: None   Follow-Up: At [facility], you and your health needs are our priority.  As part of our continuing mission to provide you with exceptional heart care, we have created designated Provider Care Teams.  These Care Teams include your primary Cardiologist (physician) and Advanced Practice Providers (APPs -  Physician Assistants and Nurse Practitioners) who all work together to provide you with the care you need, when you need it.  We recommend signing up for the patient portal called "MyChart".  Sign up information is provided on this After Visit Summary.  MyChart is used to connect with patients for Virtual Visits (Telemedicine).  Patients are able to view lab/test results, encounter notes, upcoming appointments, etc.  Non-urgent messages can be sent to your provider as well.   To learn more about what you can do with MyChart, go to [email or web address].    Your next appointment:   2 month(s)  Provider:   Perlie Gold, PA-C

## 2023-04-19 LAB — CBC
Hematocrit: 48.8 % (ref 37.5–51.0)
Hemoglobin: 16.7 g/dL (ref 13.0–17.7)
MCH: 32.5 pg (ref 26.6–33.0)
MCHC: 34.2 g/dL (ref 31.5–35.7)
MCV: 95 fL (ref 79–97)
Platelets: 163 10*3/uL (ref 150–450)
RBC: 5.14 x10E6/uL (ref 4.14–5.80)
RDW: 11.7 % (ref 11.6–15.4)
WBC: 7.1 10*3/uL (ref 3.4–10.8)

## 2023-04-19 LAB — COMPREHENSIVE METABOLIC PANEL
ALT: 24 [IU]/L (ref 0–44)
AST: 19 [IU]/L (ref 0–40)
Albumin: 4.7 g/dL (ref 3.8–4.9)
Alkaline Phosphatase: 67 [IU]/L (ref 44–121)
BUN/Creatinine Ratio: 20 (ref 9–20)
BUN: 13 mg/dL (ref 6–24)
Bilirubin Total: 0.5 mg/dL (ref 0.0–1.2)
CO2: 23 mmol/L (ref 20–29)
Calcium: 9.5 mg/dL (ref 8.7–10.2)
Chloride: 101 mmol/L (ref 96–106)
Creatinine, Ser: 0.65 mg/dL — ABNORMAL LOW (ref 0.76–1.27)
Globulin, Total: 2 g/dL (ref 1.5–4.5)
Glucose: 93 mg/dL (ref 70–99)
Potassium: 4.7 mmol/L (ref 3.5–5.2)
Sodium: 138 mmol/L (ref 134–144)
Total Protein: 6.7 g/dL (ref 6.0–8.5)
eGFR: 111 mL/min/{1.73_m2} (ref >59)

## 2023-04-19 LAB — LIPID PANEL
Chol/HDL Ratio: 2.8 {ratio} (ref 0.0–5.0)
Cholesterol, Total: 173 mg/dL (ref 100–199)
HDL: 62 mg/dL (ref >39)
LDL Chol Calc (NIH): 100 mg/dL — ABNORMAL HIGH (ref 0–99)
Triglycerides: 56 mg/dL (ref 0–149)
VLDL Cholesterol Cal: 11 mg/dL (ref 5–40)

## 2023-04-26 ENCOUNTER — Telehealth: Payer: Self-pay | Admitting: Internal Medicine

## 2023-04-26 DIAGNOSIS — E785 Hyperlipidemia, unspecified: Secondary | ICD-10-CM

## 2023-04-26 DIAGNOSIS — Z79899 Other long term (current) drug therapy: Secondary | ICD-10-CM

## 2023-04-26 DIAGNOSIS — E7841 Elevated Lipoprotein(a): Secondary | ICD-10-CM

## 2023-04-26 DIAGNOSIS — I2510 Atherosclerotic heart disease of native coronary artery without angina pectoris: Secondary | ICD-10-CM

## 2023-04-26 MED ORDER — EZETIMIBE 10 MG PO TABS
10.0000 mg | ORAL_TABLET | Freq: Every day | ORAL | 2 refills | Status: AC
Start: 2023-04-26 — End: ?

## 2023-04-26 NOTE — Telephone Encounter (Signed)
Patient is returning phone call in regards to lab results.

## 2023-04-26 NOTE — Telephone Encounter (Signed)
-----   Message from Perlie Gold sent at 04/19/2023  5:05 PM EDT ----- Labs look great with the exception of LDL which has increased from 79 to 100. Given this and the fact that his lipoprotein A was elevated last year (this is a sub-component of LDL), I do recommend management of lipids. I recommend starting Zetia 10mg  (non-statin cholesterol medication) and referral to pharmD lipid clinic.

## 2023-04-26 NOTE — Telephone Encounter (Signed)
The patient has been notified of the result and verbalized understanding.  All questions (if any) were answered.  Pt aware to start taking zetia 10 mg po daily and we will refer him to our lipid clinic to see the Pharmacist.   Confirmed the pharmacy of choice with the pt.  Pt aware that I will place the referral to lipid clinic in the system and send a message to their scheduler to call him back to arrange this appt.  Pt verbalized understanding and agrees with this plan.

## 2023-06-19 ENCOUNTER — Encounter: Payer: Self-pay | Admitting: Physician Assistant

## 2023-06-19 NOTE — Progress Notes (Unsigned)
   Cardiology Office Note    Date:  06/19/2023  ID:  Jesse Ruiz, DOB 1965/08/06, MRN [id number] PCP:  Beatrix Fetters, MD  Cardiologist:  Orbie Pyo, MD  Electrophysiologist:  None   Chief Complaint: ***  History of Present Illness: .    Jesse Ruiz is a 57 y.o. male with visit-pertinent history of mild CAD by cor CT, HTN, HLD, atherosclerosis of aorta/iliacs, pulmonary nodules, hepatic steatosis, tobacco abuse, aortic dilation, borderline DM, obesity, TBI and PE age 35 seen for follow-up. Established care in 09/2021 for chest pain. Cor CT showed CAC 40.8, 70%ile, minimal CAD in LAD/RCA, dilated PA suggestive of PHTN. Overread noted multiple pulmonary nodules with recommended CT 6-12 months and hepatic steatosis. Echo showed EF 55-60%, mild LVH, aortic sclerosis without stenosis, borderline dilation of ascending aorta measuring 36mm though CT showed normal caliber aorta. AAA duplex negative for AAA but did note mild atherosclerosis of the aorta and iliac arteries. He was started on Crestor but stopped due to flu-like symptoms. He continued to have leg pain even off statin. He was referred to Dr. Rennis Golden for elevated Lp(a) but not seen. At last OV 03/2023 he was trialed on ezetimibe instead. He was referred to lipid clinic but not seen.  Lipid plan - referred to Dr. Rennis Golden for LPA Repeat CT noncon pulm nodules Echo 09/2023 for aorta No TSH, a1c ? PAD plan given leg pain  Mild CAD, HLD Suspected PAD by AAA duplex 04/2022 HTN Pulmonary nodules, hepatic steatosis   Labwork independently reviewed: 03/2023 K 4.7, Cr 0.65, LFTs ok, CBC wnl, trig 56, LDL 100  ROS: .    Please see the history of present illness. Otherwise, review of systems is positive for ***.  All other systems are reviewed and otherwise negative.  Studies Reviewed: Marland Kitchen    EKG:  EKG is ordered today, personally reviewed, demonstrating ***  CV Studies: Cardiac studies reviewed are outlined and summarized above. Otherwise  please see EMR for full report.   Current Reported Medications:.    No outpatient medications have been marked as taking for the 06/20/23 encounter (Appointment) with Laurann Montana, PA-C.    Physical Exam:    VS:  There were no vitals taken for this visit.   Wt Readings from Last 3 Encounters:  04/18/23 246 lb 6.4 oz (111.8 kg)  04/14/22 273 lb 3.2 oz (123.9 kg)  10/01/21 287 lb (130.2 kg)    GEN: Well nourished, well developed in no acute distress NECK: No JVD; No carotid bruits CARDIAC: ***RRR, no murmurs, rubs, gallops RESPIRATORY:  Clear to auscultation without rales, wheezing or rhonchi  ABDOMEN: Soft, non-tender, non-distended EXTREMITIES:  No edema; No acute deformity   Asessement and Plan:.     ***     Disposition: F/u with ***  Signed, Laurann Montana, PA-C

## 2023-06-20 ENCOUNTER — Ambulatory Visit: Payer: Medicare Other | Attending: Cardiology | Admitting: Physician Assistant

## 2023-06-20 DIAGNOSIS — R918 Other nonspecific abnormal finding of lung field: Secondary | ICD-10-CM

## 2023-06-20 DIAGNOSIS — I739 Peripheral vascular disease, unspecified: Secondary | ICD-10-CM

## 2023-06-20 DIAGNOSIS — I2510 Atherosclerotic heart disease of native coronary artery without angina pectoris: Secondary | ICD-10-CM

## 2023-06-20 DIAGNOSIS — I10 Essential (primary) hypertension: Secondary | ICD-10-CM

## 2023-06-20 DIAGNOSIS — K760 Fatty (change of) liver, not elsewhere classified: Secondary | ICD-10-CM

## 2023-06-20 DIAGNOSIS — E785 Hyperlipidemia, unspecified: Secondary | ICD-10-CM

## 2023-06-23 ENCOUNTER — Ambulatory Visit: Payer: Medicare Other | Admitting: Cardiology

## 2023-08-23 ENCOUNTER — Other Ambulatory Visit: Payer: Self-pay

## 2023-08-23 DIAGNOSIS — I77819 Aortic ectasia, unspecified site: Secondary | ICD-10-CM

## 2023-08-23 MED ORDER — LISINOPRIL 5 MG PO TABS: 5.0000 mg | ORAL_TABLET | Freq: Every day | ORAL | 2 refills | Status: AC
